# Patient Record
Sex: Male | Born: 1990 | Race: White | Hispanic: No | Marital: Married | Smoking: Current every day smoker
Health system: Southern US, Community
[De-identification: ages and names within clinical notes are randomized; demographics above are authoritative.]

## PROBLEM LIST (undated history)

## (undated) DIAGNOSIS — J329 Chronic sinusitis, unspecified: Secondary | ICD-10-CM

## (undated) DIAGNOSIS — K37 Unspecified appendicitis: Secondary | ICD-10-CM

## (undated) DIAGNOSIS — E669 Obesity, unspecified: Secondary | ICD-10-CM

## (undated) DIAGNOSIS — S42352B Displaced comminuted fracture of shaft of humerus, left arm, initial encounter for open fracture: Secondary | ICD-10-CM

## (undated) HISTORY — PX: WISDOM TOOTH EXTRACTION: SHX21

## (undated) HISTORY — PX: APPENDECTOMY: SHX54

---

## 2009-10-10 ENCOUNTER — Emergency Department (HOSPITAL_COMMUNITY): Admission: EM | Admit: 2009-10-10 | Discharge: 2009-10-10 | Payer: Self-pay | Admitting: Emergency Medicine

## 2012-12-13 ENCOUNTER — Emergency Department (HOSPITAL_COMMUNITY): Payer: 59

## 2012-12-13 ENCOUNTER — Emergency Department (HOSPITAL_COMMUNITY)
Admission: EM | Admit: 2012-12-13 | Discharge: 2012-12-13 | Disposition: A | Discharge status: Home / Self Care | Payer: 59 | Attending: Emergency Medicine | Admitting: Emergency Medicine

## 2012-12-13 ENCOUNTER — Encounter (HOSPITAL_COMMUNITY): Payer: Self-pay | Admitting: Emergency Medicine

## 2012-12-13 DIAGNOSIS — K2970 Gastritis, unspecified, without bleeding: Secondary | ICD-10-CM

## 2012-12-13 DIAGNOSIS — F17200 Nicotine dependence, unspecified, uncomplicated: Secondary | ICD-10-CM | POA: Insufficient documentation

## 2012-12-13 LAB — COMPREHENSIVE METABOLIC PANEL
ALT: 27 U/L (ref 0–53)
Albumin: 4 g/dL (ref 3.5–5.2)
Alkaline Phosphatase: 78 U/L (ref 39–117)
BUN: 12 mg/dL (ref 6–23)
Potassium: 3.8 mEq/L (ref 3.5–5.1)
Sodium: 137 mEq/L (ref 135–145)
Total Protein: 6.8 g/dL (ref 6.0–8.3)

## 2012-12-13 LAB — CBC WITH DIFFERENTIAL/PLATELET
Basophils Relative: 1 % (ref 0–1)
Eosinophils Absolute: 0.2 10*3/uL (ref 0.0–0.7)
Eosinophils Relative: 3 % (ref 0–5)
Hemoglobin: 15.8 g/dL (ref 13.0–17.0)
Lymphs Abs: 2.1 10*3/uL (ref 0.7–4.0)
MCH: 30.7 pg (ref 26.0–34.0)
MCHC: 36.7 g/dL — ABNORMAL HIGH (ref 30.0–36.0)
MCV: 83.9 fL (ref 78.0–100.0)
Monocytes Relative: 8 % (ref 3–12)
Neutro Abs: 3.4 10*3/uL (ref 1.7–7.7)
Neutrophils Relative %: 55 % (ref 43–77)
Platelets: 172 10*3/uL (ref 150–400)
RBC: 5.14 MIL/uL (ref 4.22–5.81)
RDW: 11.8 % (ref 11.5–15.5)

## 2012-12-13 LAB — URINALYSIS, ROUTINE W REFLEX MICROSCOPIC
Bilirubin Urine: NEGATIVE
Hgb urine dipstick: NEGATIVE
Nitrite: NEGATIVE
Specific Gravity, Urine: 1.025 (ref 1.005–1.030)
pH: 6 (ref 5.0–8.0)

## 2012-12-13 LAB — LIPASE, BLOOD: Lipase: 28 U/L (ref 11–59)

## 2012-12-13 MED ORDER — PANTOPRAZOLE SODIUM 40 MG IV SOLR
40.0000 mg | Freq: Once | INTRAVENOUS | Status: AC
  Administered 2012-12-13: 40 mg via INTRAVENOUS
  Filled 2012-12-13: qty 40

## 2012-12-13 MED ORDER — SODIUM CHLORIDE 0.9 % IV BOLUS (SEPSIS)
1000.0000 mL | Freq: Once | INTRAVENOUS | Status: AC
  Administered 2012-12-13: 1000 mL via INTRAVENOUS

## 2012-12-13 MED ORDER — ONDANSETRON HCL 4 MG/2ML IJ SOLN
4.0000 mg | Freq: Once | INTRAMUSCULAR | Status: AC
  Administered 2012-12-13: 4 mg via INTRAVENOUS
  Filled 2012-12-13: qty 2

## 2012-12-13 MED ORDER — HYDROMORPHONE HCL PF 1 MG/ML IJ SOLN
1.0000 mg | Freq: Once | INTRAMUSCULAR | Status: AC
  Administered 2012-12-13: 1 mg via INTRAVENOUS
  Filled 2012-12-13: qty 1

## 2012-12-13 MED ORDER — PANTOPRAZOLE SODIUM 20 MG PO TBEC: 20.0000 mg | DELAYED_RELEASE_TABLET | Freq: Every day | ORAL | Status: DC

## 2012-12-13 NOTE — ED Provider Notes (Signed)
CSN: [id number]     Arrival date & time 12/13/12  1153 History   First MD Initiated Contact with Patient 12/13/12 1219     Chief Complaint  Patient presents with  . Abdominal Pain  . Emesis   (Consider location/radiation/quality/duration/timing/severity/associated sxs/prior Treatment) HPI 22 year old male comes in today complaining of four-day history of epigastric discomfort which is exacerbated with eating. The pain is associated with some nausea some vomiting. Patient has been tolerating by mouth intermittently and in fact ate lunch just prior to coming in here and has not vomited. Not taken anything for pain. Had similar episodes in the past. He has a family history of gallstones. Not had any prior surgeries. He denies fever or chills. History reviewed. No pertinent past medical history. History reviewed. No pertinent past surgical history. History reviewed. No pertinent family history. History  Substance Use Topics  . Smoking status: Current Every Day Smoker  . Smokeless tobacco: Not on file  . Alcohol Use: Yes     Comment: occ    Review of Systems  All other systems reviewed and are negative.    Allergies  Review of patient's allergies indicates no known allergies.  Home Medications  No current outpatient prescriptions on file. BP 113/64  Pulse 57  Temp(Src) 97.8 F (36.6 C)  Resp 18  Ht 5\' 10"  (1.778 m)  Wt 266 lb 12.8 oz (121.02 kg)  BMI 38.28 kg/m2  SpO2 95% Physical Exam  Nursing note and vitals reviewed. Constitutional: He is oriented to person, place, and time. He appears well-developed and well-nourished.  HENT:  Head: Normocephalic and atraumatic.  Right Ear: External ear normal.  Left Ear: External ear normal.  Nose: Nose normal.  Mouth/Throat: Oropharynx is clear and moist.  Eyes: Conjunctivae and EOM are normal. Pupils are equal, round, and reactive to light.  Neck: Normal range of motion. Neck supple.  Cardiovascular: Normal rate, regular rhythm  and normal heart sounds.   Pulmonary/Chest: Effort normal and breath sounds normal.  Abdominal: Soft. Bowel sounds are normal.  Moderate ttp epigastrium   Musculoskeletal: Normal range of motion.  Neurological: He is alert and oriented to person, place, and time.  Skin: Skin is warm and dry.  Psychiatric: He has a normal mood and affect. His behavior is normal. Thought content normal.    ED Course  Procedures (including critical care time) Labs Review Labs Reviewed  CBC WITH DIFFERENTIAL - Abnormal; Notable for the following:    MCHC 36.7 (*)    All other components within normal limits  COMPREHENSIVE METABOLIC PANEL - Abnormal; Notable for the following:    Glucose, Bld 100 (*)    All other components within normal limits  LIPASE, BLOOD  URINALYSIS, ROUTINE W REFLEX MICROSCOPIC   Imaging Review US Abdomen Complete  12/13/2012   CLINICAL DATA:  Abdominal pain  EXAM: ULTRASOUND ABDOMEN COMPLETE  COMPARISON:  None.  FINDINGS: Gallbladder:  The gallbladder is contracted likely due to a meal approximately 30 min prior to the study. No stones are identified. The gallbladder wall is not abnormally thickened. There is no positive sonographic Murphy's sign.  Common bile duct:  Diameter: 2.8 mm.  Liver:  The echotexture of the liver is somewhat increased which limits penetration of the ultrasound beam. There is no focal mass nor ductal dilation.  IVC:  No abnormality visualized.  Pancreas:  Bowel gas limits evaluation of the pancreas but the observed portions appear normal.  Spleen:  Size and appearance within normal limits.  Right  Kidney:  Length: 10.7 cm. The echogenicity is within the limits of normal. There is no hydronephrosis. .  Left Kidney:  Length: 11.9 cm. Echogenicity within normal limits. No mass or hydronephrosis visualized.  Abdominal aorta:  Bowel gas limits evaluation of the abdominal aorta. The maximal measured diameter is 2.1 cm.  Other findings:  None.  IMPRESSION: 1. The  gallbladder is contracted likely due to the recent meal. There is no evidence of acute cholecystitis nor gallstones. 2. The liver exhibits increased echotexture which likely reflects fatty infiltration. The common bile duct is normal in diameter. 3. The observed portions of the pancreas and abdominal aorta appear normal. 4. No acute abnormality of the kidneys is demonstrated.   Electronically Signed   By: David  [place]   On: 12/13/2012 13:31    EKG Interpretation   None      Results for orders placed during the hospital encounter of 12/13/12  CBC WITH DIFFERENTIAL      Result Value Range   WBC 6.2  4.0 - 10.5 K/uL   RBC 5.14  4.22 - 5.81 MIL/uL   Hemoglobin 15.8  13.0 - 17.0 g/dL   HCT 16.1  09.6 - 04.5 %   MCV 83.9  78.0 - 100.0 fL   MCH 30.7  26.0 - 34.0 pg   MCHC 36.7 (*) 30.0 - 36.0 g/dL   RDW 40.9  81.1 - 91.4 %   Platelets 172  150 - 400 K/uL   Neutrophils Relative % 55  43 - 77 %   Neutro Abs 3.4  1.7 - 7.7 K/uL   Lymphocytes Relative 33  12 - 46 %   Lymphs Abs 2.1  0.7 - 4.0 K/uL   Monocytes Relative 8  3 - 12 %   Monocytes Absolute 0.5  0.1 - 1.0 K/uL   Eosinophils Relative 3  0 - 5 %   Eosinophils Absolute 0.2  0.0 - 0.7 K/uL   Basophils Relative 1  0 - 1 %   Basophils Absolute 0.1  0.0 - 0.1 K/uL  COMPREHENSIVE METABOLIC PANEL      Result Value Range   Sodium 137  135 - 145 mEq/L   Potassium 3.8  3.5 - 5.1 mEq/L   Chloride 105  96 - 112 mEq/L   CO2 22  19 - 32 mEq/L   Glucose, Bld 100 (*) 70 - 99 mg/dL   BUN 12  6 - 23 mg/dL   Creatinine, Ser 7.82  0.50 - 1.35 mg/dL   Calcium 9.4  8.4 - 95.6 mg/dL   Total Protein 6.8  6.0 - 8.3 g/dL   Albumin 4.0  3.5 - 5.2 g/dL   AST 19  0 - 37 U/L   ALT 27  0 - 53 U/L   Alkaline Phosphatase 78  39 - 117 U/L   Total Bilirubin 0.5  0.3 - 1.2 mg/dL   GFR calc non Af Amer >90  >90 mL/min   GFR calc Af Amer >90  >90 mL/min  LIPASE, BLOOD      Result Value Range   Lipase 28  11 - 59 U/L  URINALYSIS, ROUTINE W REFLEX  MICROSCOPIC      Result Value Range   Color, Urine YELLOW  YELLOW   APPearance CLEAR  CLEAR   Specific Gravity, Urine 1.025  1.005 - 1.030   pH 6.0  5.0 - 8.0   Glucose, UA NEGATIVE  NEGATIVE mg/dL   Hgb urine dipstick NEGATIVE  NEGATIVE  Bilirubin Urine NEGATIVE  NEGATIVE   Ketones, ur NEGATIVE  NEGATIVE mg/dL   Protein, ur NEGATIVE  NEGATIVE mg/dL   Urobilinogen, UA 1.0  0.0 - 1.0 mg/dL   Nitrite NEGATIVE  NEGATIVE   Leukocytes, UA NEGATIVE  NEGATIVE    MDM  No diagnosis found. Patient with epigastric tenderness palpation normal labs and ultrasound without any gallstones and negative lipase. I feel that it is likely that he has asterixis and he is advised to use protonic sound clear liquids for the next 12-24 hours. He is given return precautions and advised that he should return to emergency department if he develops worsening pain, fever, or is unable to tolerate liquids.  Hilario Quarry, MD 12/13/12 [phone]

## 2012-12-13 NOTE — ED Notes (Signed)
Pt c/o mid upper abd pain through to back worse after eating with N/V x 4 days

## 2013-09-08 ENCOUNTER — Encounter (HOSPITAL_COMMUNITY): Payer: Self-pay | Admitting: Emergency Medicine

## 2013-09-08 ENCOUNTER — Emergency Department (HOSPITAL_COMMUNITY)
Admission: EM | Admit: 2013-09-08 | Discharge: 2013-09-09 | Disposition: A | Discharge status: Home / Self Care | Payer: 59 | Attending: Emergency Medicine | Admitting: Emergency Medicine

## 2013-09-08 ENCOUNTER — Emergency Department (HOSPITAL_COMMUNITY): Payer: 59

## 2013-09-08 DIAGNOSIS — Y9289 Other specified places as the place of occurrence of the external cause: Secondary | ICD-10-CM | POA: Insufficient documentation

## 2013-09-08 DIAGNOSIS — S1093XA Contusion of unspecified part of neck, initial encounter: Secondary | ICD-10-CM | POA: Diagnosis not present

## 2013-09-08 DIAGNOSIS — H5702 Anisocoria: Secondary | ICD-10-CM | POA: Diagnosis not present

## 2013-09-08 DIAGNOSIS — IMO0002 Reserved for concepts with insufficient information to code with codable children: Secondary | ICD-10-CM | POA: Insufficient documentation

## 2013-09-08 DIAGNOSIS — Y9389 Activity, other specified: Secondary | ICD-10-CM | POA: Insufficient documentation

## 2013-09-08 DIAGNOSIS — S0083XA Contusion of other part of head, initial encounter: Secondary | ICD-10-CM | POA: Insufficient documentation

## 2013-09-08 DIAGNOSIS — F17200 Nicotine dependence, unspecified, uncomplicated: Secondary | ICD-10-CM | POA: Insufficient documentation

## 2013-09-08 DIAGNOSIS — Z79899 Other long term (current) drug therapy: Secondary | ICD-10-CM | POA: Insufficient documentation

## 2013-09-08 DIAGNOSIS — S0990XA Unspecified injury of head, initial encounter: Secondary | ICD-10-CM | POA: Diagnosis not present

## 2013-09-08 DIAGNOSIS — S0003XA Contusion of scalp, initial encounter: Secondary | ICD-10-CM | POA: Diagnosis not present

## 2013-09-08 MED ORDER — METOCLOPRAMIDE HCL 5 MG/ML IJ SOLN: 10.0000 mg | Freq: Once | INTRAMUSCULAR | Status: DC

## 2013-09-08 MED ORDER — ONDANSETRON HCL 4 MG PO TABS: 4.0000 mg | ORAL_TABLET | Freq: Four times a day (QID) | ORAL | Status: DC

## 2013-09-08 MED ORDER — DEXAMETHASONE SODIUM PHOSPHATE 10 MG/ML IJ SOLN
10.0000 mg | Freq: Once | INTRAMUSCULAR | Status: AC
  Administered 2013-09-08: 10 mg via INTRAVENOUS
  Filled 2013-09-08: qty 1

## 2013-09-08 MED ORDER — METOCLOPRAMIDE HCL 5 MG/ML IJ SOLN
10.0000 mg | Freq: Once | INTRAMUSCULAR | Status: AC
  Administered 2013-09-08: 10 mg via INTRAVENOUS
  Filled 2013-09-08: qty 2

## 2013-09-08 MED ORDER — NAPROXEN 500 MG PO TABS: 500.0000 mg | ORAL_TABLET | Freq: Two times a day (BID) | ORAL | Status: DC

## 2013-09-08 MED ORDER — DEXAMETHASONE SODIUM PHOSPHATE 10 MG/ML IJ SOLN: 10.0000 mg | Freq: Once | INTRAMUSCULAR | Status: DC

## 2013-09-08 NOTE — ED Notes (Signed)
Pt c/o hitting head on metal equipment while at work; pt sts pain with nausea; pt unsure of LOC

## 2013-09-08 NOTE — ED Notes (Signed)
Pt monitored by pulse ox, bp cuff, and 5-lead. 

## 2013-09-08 NOTE — Discharge Instructions (Signed)
You have been placed on head injury precautions today. Refrain from strenuous activity and heavy lifting. You may not operate heavy machinery or drive a motor vehicle until your cleared from these precautions in one week. Follow up with your primary care doctor in one week to be cleared from these precautions. Recommend naproxen as needed for pain control. You may take Zofran as needed for nausea. Return to the emergency department as needed if symptoms worsen.  Head Injury You have received a head injury. It does not appear serious at this time. Headaches and vomiting are common following head injury. It should be easy to awaken from sleeping. Sometimes it is necessary for you to stay in the emergency department for a while for observation. Sometimes admission to the hospital may be needed. After injuries such as yours, most problems occur within the first 24 hours, but side effects may occur up to 7-10 days after the injury. It is important for you to carefully monitor your condition and contact your health care provider or seek immediate medical care if there is a change in your condition. WHAT ARE THE TYPES OF HEAD INJURIES? Head injuries can be as minor as a bump. Some head injuries can be more severe. More severe head injuries include:  A jarring injury to the brain (concussion).  A bruise of the brain (contusion). This mean there is bleeding in the brain that can cause swelling.  A cracked skull (skull fracture).  Bleeding in the brain that collects, clots, and forms a bump (hematoma). WHAT CAUSES A HEAD INJURY? A serious head injury is most likely to happen to someone who is in a car wreck and is not wearing a seat belt. Other causes of major head injuries include bicycle or motorcycle accidents, sports injuries, and falls. HOW ARE HEAD INJURIES DIAGNOSED? A complete history of the event leading to the injury and your current symptoms will be helpful in diagnosing head injuries. Many times,  pictures of the brain, such as CT or MRI are needed to see the extent of the injury. Often, an overnight hospital stay is necessary for observation.  WHEN SHOULD I SEEK IMMEDIATE MEDICAL CARE?  You should get help right away if:  You have confusion or drowsiness.  You feel sick to your stomach (nauseous) or have continued, forceful vomiting.  You have dizziness or unsteadiness that is getting worse.  You have severe, continued headaches not relieved by medicine. Only take over-the-counter or prescription medicines for pain, fever, or discomfort as directed by your health care provider.  You do not have normal function of the arms or legs or are unable to walk.  You notice changes in the black spots in the center of the colored part of your eye (pupil).  You have a clear or bloody fluid coming from your nose or ears.  You have a loss of vision. During the next 24 hours after the injury, you must stay with someone who can watch you for the warning signs. This person should contact local emergency services (911 in the [place] if you have seizures, you become unconscious, or you are unable to wake up. HOW CAN I PREVENT A HEAD INJURY IN THE FUTURE? The most important factor for preventing major head injuries is avoiding motor vehicle accidents. To minimize the potential for damage to your head, it is crucial to wear seat belts while riding in motor vehicles. Wearing helmets while bike riding and playing collision sports (like football) is also helpful. Also, avoiding  dangerous activities around the house will further help reduce your risk of head injury.  WHEN CAN I RETURN TO NORMAL ACTIVITIES AND ATHLETICS? You should be reevaluated by your health care provider before returning to these activities. If you have any of the following symptoms, you should not return to activities or contact sports until 1 week after the symptoms have stopped:  Persistent headache.  Dizziness or vertigo.  Poor  attention and concentration.  Confusion.  Memory problems.  Nausea or vomiting.  Fatigue or tire easily.  Irritability.  Intolerant of bright lights or loud noises.  Anxiety or depression.  Disturbed sleep. MAKE SURE YOU:   Understand these instructions.  Will watch your condition.  Will get help right away if you are not doing well or get worse. Document Released: 01/02/2005 Document Revised: 01/07/2013 Document Reviewed: 09/09/2012 [facility] Patient Information 2015 [place], [place]. This information is not intended to replace advice given to you by your health care provider. Make sure you discuss any questions you have with your health care provider.

## 2013-09-08 NOTE — ED Provider Notes (Signed)
CSN: [id number]     Arrival date & time 09/08/13  1724 History   First MD Initiated Contact with Patient 09/08/13 2053     Chief Complaint  Patient presents with  . Head Injury     (Consider location/radiation/quality/duration/timing/severity/associated sxs/prior Treatment) HPI Comments: Patient is a 23 year old male with no significant past medical history who presents to the emergency department for further evaluation of a headache following head trauma at 1500 today. Patient states that he was getting up from a squatting position and walking backwards when he hit his head on a metal equipment at work. Patient is unsure of loss of consciousness, but does not think that he lost consciousness. Patient states that he has been experiencing a diffuse headache and nausea since the incident. Patient denies vision loss, hearing loss, difficulty speaking and swallowing, vomiting, extremity numbness/weakness, inability to ambulate.  Patient is a 23 y.o. male presenting with head injury. The history is provided by the patient. No language interpreter was used.  Head Injury Associated symptoms: headache and nausea     History reviewed. No pertinent past medical history. History reviewed. No pertinent past surgical history. History reviewed. No pertinent family history. History  Substance Use Topics  . Smoking status: Current Every Day Smoker  . Smokeless tobacco: Not on file  . Alcohol Use: Yes     Comment: occ    Review of Systems  Constitutional: Negative for fever.  Eyes: Negative for visual disturbance.  Gastrointestinal: Positive for nausea.  Skin: Positive for wound (hit back of head).  Neurological: Positive for headaches.  All other systems reviewed and are negative.    Allergies  Nutrasweet aspartame  Home Medications   Prior to Admission medications   Medication Sig Start Date End Date Taking? Authorizing Provider  Aspirin-Acetaminophen-Caffeine (EXCEDRIN PO) Take 2  tablets by mouth 2 (two) times daily as needed (for pain).   Yes Historical Provider, MD  cetirizine (ZYRTEC) 10 MG tablet Take 10 mg by mouth daily.   Yes Historical Provider, MD  naproxen (NAPROSYN) 500 MG tablet Take 1 tablet (500 mg total) by mouth 2 (two) times daily. 09/08/13   Antony Madura, PA-C  ondansetron (ZOFRAN) 4 MG tablet Take 1 tablet (4 mg total) by mouth every 6 (six) hours. 09/08/13   Antony Madura, PA-C   BP 130/86  Pulse 68  Temp(Src) 98.7 F (37.1 C) (Oral)  Resp 18  SpO2 99%  Physical Exam  Nursing note and vitals reviewed. Constitutional: He is oriented to person, place, and time. He appears well-developed and well-nourished. No distress.  Nontoxic/nonseptic appearing  HENT:  Head: Normocephalic.  Mouth/Throat: Oropharynx is clear and moist. No oropharyngeal exudate.  Small contusion to posterior scalp. No hematoma. No skull instability.  Eyes: Conjunctivae and EOM are normal. No scleral icterus.  Anisocoria. Pupils round and reactive to direct and consensual light.  Neck: Normal range of motion.  Cardiovascular: Normal rate, regular rhythm and intact distal pulses.   Pulmonary/Chest: Effort normal and breath sounds normal. No respiratory distress. He has no wheezes. He has no rales.  Musculoskeletal: Normal range of motion.  Neurological: He is alert and oriented to person, place, and time. No cranial nerve deficit. He exhibits normal muscle tone. Coordination normal.  GCS 15. Patient speaks in full goal oriented sentences. He answers questions appropriately and follows simple commands. No cranial nerve deficits appreciated; symmetric eyebrow raise, no facial drooping, tongue midline. Patient has equal grip strength and strength against resistance bilaterally; strength 5/5. No gross  sensory deficits appreciated. Patient moves extremities without ataxia; no pronator drift and finger-to-nose intact. DTRs normal and symmetric.  Skin: Skin is warm and dry. No rash noted. He  is not diaphoretic. No erythema. No pallor.  Psychiatric: He has a normal mood and affect. His behavior is normal.    ED Course  Procedures (including critical care time) Labs Review Labs Reviewed - No data to display  Imaging Review Ct Head Wo Contrast  09/08/2013   CLINICAL DATA:  Headaches after being struck on the top of the head.  EXAM: CT HEAD WITHOUT CONTRAST  TECHNIQUE: Contiguous axial images were obtained from the base of the skull through the vertex without intravenous contrast.  COMPARISON:  None.  FINDINGS: Ventricles and sulci appear symmetrical. No mass effect or midline shift. No abnormal extra-axial fluid collections. Gray-white matter junctions are distinct. Basal cisterns are not effaced. No evidence of acute intracranial hemorrhage. No depressed skull fractures. Mucosal thickening in the paranasal sinuses. Mastoid air cells appear patent.  IMPRESSION: No acute intracranial abnormalities.   Electronically Signed   By: Burman Nieves M.D.   On: 09/08/2013 23:20     EKG Interpretation None      MDM   Final diagnoses:  Head injury without skull fracture, initial encounter    23 year old male presents to the emergency department for further evaluation of a head injury. Patient states that his head hit some metal equipment at work at 1500. Patient unsure of LOC, but does not believe he lost consciousness. Patient is well and nontoxic appearing with a nonfocal neurologic exam today. Symptoms treatment in the ED with Decadron and Reglan.  CT head ordered for further evaluation of injury which shows no evidence of skull fracture, hemorrhage, or hydrocephalus. No midline shift. Patient has remained neurologically stable. He is sleeping in the exam room bed in no visible or audible discomfort on reevaluation; pain improved with headache regimen. Patient placed on head injury precautions. He has been instructed to followup with his primary care doctor in one week to be cleared  from his precautions. Return precautions discussed and provided. Patient agreeable to plan with no unaddressed concerns.   Filed Vitals:   09/08/13 2130 09/08/13 2200 09/08/13 2215 09/08/13 2332  BP: 147/96 114/67 131/96 130/86  Pulse: 75 73 70 68  Temp:      TempSrc:      Resp: SpO2: 98% 98% 95% 99%     Antony Madura, PA-C 09/08/13 2353

## 2013-09-08 NOTE — ED Notes (Signed)
Apologized to pt for wait time. Pt in NAD. AO x4.  

## 2013-09-09 NOTE — ED Notes (Signed)
Pt denies headache or dizziness. gcs 15. perrrla.

## 2013-09-09 NOTE — ED Provider Notes (Signed)
Medical screening examination/treatment/procedure(s) were performed by non-physician practitioner and as supervising physician I was immediately available for consultation/collaboration.   EKG Interpretation None        StephenGlynn Octave 09/09/13 [id number]

## 2014-02-22 ENCOUNTER — Encounter (HOSPITAL_COMMUNITY): Payer: Self-pay | Admitting: *Deleted

## 2014-02-22 ENCOUNTER — Emergency Department (INDEPENDENT_AMBULATORY_CARE_PROVIDER_SITE_OTHER): Payer: 59

## 2014-02-22 ENCOUNTER — Emergency Department (INDEPENDENT_AMBULATORY_CARE_PROVIDER_SITE_OTHER)
Admission: EM | Admit: 2014-02-22 | Discharge: 2014-02-22 | Disposition: A | Discharge status: Home / Self Care | Payer: 59 | Source: Home / Self Care | Attending: Emergency Medicine | Admitting: Emergency Medicine

## 2014-02-22 DIAGNOSIS — J209 Acute bronchitis, unspecified: Secondary | ICD-10-CM

## 2014-02-22 MED ORDER — KETOROLAC TROMETHAMINE 60 MG/2ML IM SOLN
INTRAMUSCULAR | Status: AC
  Filled 2014-02-22: qty 2

## 2014-02-22 MED ORDER — PREDNISONE 50 MG PO TABS: ORAL_TABLET | ORAL | Status: DC

## 2014-02-22 MED ORDER — DOXYCYCLINE HYCLATE 100 MG PO CAPS: 100.0000 mg | ORAL_CAPSULE | Freq: Two times a day (BID) | ORAL | Status: DC

## 2014-02-22 MED ORDER — KETOROLAC TROMETHAMINE 60 MG/2ML IM SOLN
60.0000 mg | Freq: Once | INTRAMUSCULAR | Status: AC
  Administered 2014-02-22: 60 mg via INTRAMUSCULAR

## 2014-02-22 MED ORDER — BENZONATATE 100 MG PO CAPS: 100.0000 mg | ORAL_CAPSULE | Freq: Three times a day (TID) | ORAL | Status: DC

## 2014-02-22 MED ORDER — HYDROCODONE-HOMATROPINE 5-1.5 MG/5ML PO SYRP: 5.0000 mL | ORAL_SOLUTION | Freq: Four times a day (QID) | ORAL | Status: DC | PRN

## 2014-02-22 NOTE — ED Provider Notes (Addendum)
CSN: [id number]     Arrival date & time 02/22/14  1630 History   First MD Initiated Contact with Patient 02/22/14 1648     Chief Complaint  Patient presents with  . Cough   (Consider location/radiation/quality/duration/timing/severity/associated sxs/prior Treatment) HPI He is a 24 year old man here for evaluation of cough. His symptoms started about 3 days ago with cough, nasal congestion, sore throat, shortness of breath. The cough is nonproductive. He denies any fevers or chills. No nausea or vomiting. Over the last day or so, he has developed pain in his rib cage with coughing and movement. He has tried over-the-counter Delsym without improvement.  History reviewed. No pertinent past medical history. History reviewed. No pertinent past surgical history. History reviewed. No pertinent family history. History  Substance Use Topics  . Smoking status: Current Every Day Smoker  . Smokeless tobacco: Not on file  . Alcohol Use: Yes     Comment: occ    Review of Systems  Constitutional: Negative for fever and chills.  HENT: Positive for congestion, rhinorrhea and sore throat.   Respiratory: Positive for cough and shortness of breath. Negative for wheezing.   Cardiovascular: Positive for chest pain (with cough).  Gastrointestinal: Negative for nausea and vomiting.    Allergies  Nutrasweet aspartame  Home Medications   Prior to Admission medications   Medication Sig Start Date End Date Taking? Authorizing Provider  Aspirin-Acetaminophen-Caffeine (EXCEDRIN PO) Take 2 tablets by mouth 2 (two) times daily as needed (for pain).    Historical Provider, MD  benzonatate (TESSALON) 100 MG capsule Take 1 capsule (100 mg total) by mouth every 8 (eight) hours. 02/22/14   Charm Rings, MD  cetirizine (ZYRTEC) 10 MG tablet Take 10 mg by mouth daily.    Historical Provider, MD  doxycycline (VIBRAMYCIN) 100 MG capsule Take 1 capsule (100 mg total) by mouth 2 (two) times daily. 02/22/14   Charm Rings,  MD  HYDROcodone-homatropine [facility]) 5-1.5 MG/5ML syrup Take 5 mLs by mouth every 6 (six) hours as needed for cough. 02/22/14   Charm Rings, MD  naproxen (NAPROSYN) 500 MG tablet Take 1 tablet (500 mg total) by mouth 2 (two) times daily. 09/08/13   Antony Madura, PA-C  ondansetron (ZOFRAN) 4 MG tablet Take 1 tablet (4 mg total) by mouth every 6 (six) hours. 09/08/13   Antony Madura, PA-C  predniSONE (DELTASONE) 50 MG tablet Take 1 pill daily for 5 days 02/22/14   Charm Rings, MD   BP 135/84 mmHg  Pulse 97  Temp(Src) 98.4 F (36.9 C) (Oral)  Resp 24  SpO2 96% Physical Exam  Constitutional: He is oriented to person, place, and time. He appears well-developed and well-nourished. No distress.  Neck: Neck supple.  Cardiovascular: Normal rate, regular rhythm and normal heart sounds.   No murmur heard. Pulmonary/Chest: Effort normal and breath sounds normal. No respiratory distress. He has no wheezes. He has no rales.  Speaking in full sentences but does appear short of breath.  Lymphadenopathy:    He has no cervical adenopathy.  Neurological: He is alert and oriented to person, place, and time.    ED Course  Procedures (including critical care time) Labs Review Labs Reviewed - No data to display  Imaging Review Dg Chest 2 View  02/22/2014   CLINICAL DATA:  Cough for 3 days.  EXAM: CHEST  2 VIEW  COMPARISON:  None.  FINDINGS: Examination is technically limited due to grid aliasing artifact. The heart size and mediastinal contours  are within normal limits. Both lungs are clear. The visualized skeletal structures are unremarkable.  IMPRESSION: No active cardiopulmonary disease.   Electronically Signed   By: Burman NievesWilliam  Stevens M.D.   On: 02/22/2014 17:55     MDM   1. Acute bronchitis, unspecified organism    We'll treat with prednisone and doxycycline. He also has associated costochondritis. Tessalon and Hycodan for cough. Follow-up if no improvement in the next 2-3 days.  toradol 60mg  IM  given.  Charm RingsErin J Keelyn Fjelstad, MD 02/22/14 [id number]  Charm RingsErin J Tywana Robotham, MD 02/24/14 [phone]

## 2014-02-22 NOTE — ED Notes (Signed)
Pt  Reports  Symptoms  Of   A  Non  Productive    Cough  With  Some  Shortness  Of  Breath  On  Exertion    Pain  In  Sides  Of  Chest  When        He  Coughs  And   On palpation           He  Reports  The  Symptoms  X  3  Days        Symptoms  Are  Not releived  By OTC  Delsym

## 2014-02-22 NOTE — Discharge Instructions (Signed)
You have bronchitis.  The pain you have with coughing is from irritation of your ribs and cartilage. Take prednisone 1 pill daily for 5 days. Take doxycycline twice a day for 10 days. Use Tessalon 3 times a day as needed for cough. This is a nondrowsy medication. He continues Hycodan cough syrup every 4-6 hours as needed for cough. This has a narcotic medication and it so do not take while driving.  You should see improvement in 2-3 days. The cough will likely take 2 weeks to fully resolve.

## 2014-08-10 ENCOUNTER — Emergency Department (HOSPITAL_COMMUNITY): Payer: Worker's Compensation

## 2014-08-10 ENCOUNTER — Encounter (HOSPITAL_COMMUNITY): Payer: Self-pay | Admitting: Family Medicine

## 2014-08-10 ENCOUNTER — Emergency Department (HOSPITAL_COMMUNITY)
Admission: EM | Admit: 2014-08-10 | Discharge: 2014-08-10 | Disposition: A | Discharge status: Home / Self Care | Payer: Worker's Compensation | Attending: Emergency Medicine | Admitting: Emergency Medicine

## 2014-08-10 DIAGNOSIS — Y998 Other external cause status: Secondary | ICD-10-CM | POA: Insufficient documentation

## 2014-08-10 DIAGNOSIS — Z720 Tobacco use: Secondary | ICD-10-CM | POA: Diagnosis not present

## 2014-08-10 DIAGNOSIS — Y9389 Activity, other specified: Secondary | ICD-10-CM | POA: Insufficient documentation

## 2014-08-10 DIAGNOSIS — Z79899 Other long term (current) drug therapy: Secondary | ICD-10-CM | POA: Diagnosis not present

## 2014-08-10 DIAGNOSIS — S8991XA Unspecified injury of right lower leg, initial encounter: Secondary | ICD-10-CM

## 2014-08-10 DIAGNOSIS — Z7952 Long term (current) use of systemic steroids: Secondary | ICD-10-CM | POA: Insufficient documentation

## 2014-08-10 DIAGNOSIS — S8001XA Contusion of right knee, initial encounter: Secondary | ICD-10-CM | POA: Insufficient documentation

## 2014-08-10 DIAGNOSIS — Z791 Long term (current) use of non-steroidal anti-inflammatories (NSAID): Secondary | ICD-10-CM | POA: Diagnosis not present

## 2014-08-10 DIAGNOSIS — Y9289 Other specified places as the place of occurrence of the external cause: Secondary | ICD-10-CM | POA: Diagnosis not present

## 2014-08-10 DIAGNOSIS — W1789XA Other fall from one level to another, initial encounter: Secondary | ICD-10-CM | POA: Diagnosis not present

## 2014-08-10 DIAGNOSIS — Z7982 Long term (current) use of aspirin: Secondary | ICD-10-CM | POA: Diagnosis not present

## 2014-08-10 MED ORDER — HYDROCODONE-ACETAMINOPHEN 5-325 MG PO TABS
2.0000 | ORAL_TABLET | Freq: Once | ORAL | Status: AC
  Administered 2014-08-10: 2 via ORAL
  Filled 2014-08-10: qty 2

## 2014-08-10 MED ORDER — HYDROCODONE-ACETAMINOPHEN 5-325 MG PO TABS
1.0000 | ORAL_TABLET | Freq: Four times a day (QID) | ORAL | Status: DC | PRN
Start: 2014-08-10 — End: 2016-05-25

## 2014-08-10 MED ORDER — IBUPROFEN 800 MG PO TABS: 800.0000 mg | ORAL_TABLET | Freq: Three times a day (TID) | ORAL | Status: DC

## 2014-08-10 NOTE — ED Notes (Signed)
Pt here for right knee pain. 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 sts knee hit concrete. Sts that he was given tramadol and xray and no broken bones. sts the pain meds are not working and knee feels like it is popping and grinding.

## 2014-08-10 NOTE — Discharge Instructions (Signed)
Knee Sprain A knee sprain is a tear in one of the strong, fibrous tissues that connect the bones (ligaments) in your knee. The severity of the sprain depends on how much of the ligament is torn. The tear can be either partial or complete. CAUSES  Often, sprains are a result of a fall or injury. The force of the impact causes the fibers of your ligament to stretch too much. This excess tension causes the fibers of your ligament to tear. SIGNS AND SYMPTOMS  You may have some loss of motion in your knee. Other symptoms include:  Bruising.  Pain in the knee area.  Tenderness of the knee to the touch.  Swelling. DIAGNOSIS  To diagnose a knee sprain, your health care provider will physically examine your knee. Your health care provider may also suggest an X-ray exam of your knee to make sure no bones are broken. TREATMENT  If your ligament is only partially torn, treatment usually involves keeping the knee in a fixed position (immobilization) or bracing your knee for activities that require movement for several weeks. To do this, your health care provider will apply a bandage, cast, or splint to keep your knee from moving and to support your knee during movement until it heals. For a partially torn ligament, the healing process usually takes 4-6 weeks. If your ligament is completely torn, depending on which ligament it is, you may need surgery to reconnect the ligament to the bone or reconstruct it. After surgery, a cast or splint may be applied and will need to stay on your knee for 4-6 weeks while your ligament heals. HOME CARE INSTRUCTIONS  Keep your injured knee elevated to decrease swelling.  To ease pain and swelling, apply ice to the injured area:  Put ice in a plastic bag.  Place a towel between your skin and the bag.  Leave the ice on for 20 minutes, 2-3 times a day.  Only take medicine for pain as directed by your health care provider.  Do not leave your knee unprotected until  pain and stiffness go away (usually 4-6 weeks).  If you have a cast or splint, do not allow it to get wet. If you have been instructed not to remove it, cover it with a plastic bag when you shower or bathe. Do not swim.  Your health care provider may suggest exercises for you to do during your recovery to prevent or limit permanent weakness and stiffness. SEEK IMMEDIATE MEDICAL CARE IF:  Your cast or splint becomes damaged.  Your pain becomes worse.  You have significant pain, swelling, or numbness below the cast or splint. MAKE SURE YOU:  Understand these instructions.  Will watch your condition.  Will get help right away if you are not doing well or get worse. Document Released: 01/02/2005 Document Revised: 10/23/2012 Document Reviewed: 08/14/2012 [facility] Patient Information 2015 [place], [place]. This information is not intended to replace advice given to you by your health care provider. Make sure you discuss any questions you have with your health care provider.  Contusion A contusion is a deep bruise. Contusions are the result of an injury that caused bleeding under the skin. The contusion may turn blue, purple, or yellow. Minor injuries will give you a painless contusion, but more severe contusions may stay painful and swollen for a few weeks.  CAUSES  A contusion is usually caused by a blow, trauma, or direct force to an area of the body. SYMPTOMS   Swelling and redness  of the injured area.  Bruising of the injured area.  Tenderness and soreness of the injured area.  Pain. DIAGNOSIS  The diagnosis can be made by taking a history and physical exam. An X-ray, CT scan, or MRI may be needed to determine if there were any associated injuries, such as fractures. TREATMENT  Specific treatment will depend on what area of the body was injured. In general, the best treatment for a contusion is resting, icing, elevating, and applying cold compresses to the injured area.  Over-the-counter medicines may also be recommended for pain control. Ask your caregiver what the best treatment is for your contusion. HOME CARE INSTRUCTIONS   Put ice on the injured area.  Put ice in a plastic bag.  Place a towel between your skin and the bag.  Leave the ice on for 15-20 minutes, 3-4 times a day, or as directed by your health care provider.  Only take over-the-counter or prescription medicines for pain, discomfort, or fever as directed by your caregiver. Your caregiver may recommend avoiding anti-inflammatory medicines (aspirin, ibuprofen, and naproxen) for 48 hours because these medicines may increase bruising.  Rest the injured area.  If possible, elevate the injured area to reduce swelling. SEEK IMMEDIATE MEDICAL CARE IF:   You have increased bruising or swelling.  You have pain that is getting worse.  Your swelling or pain is not relieved with medicines. MAKE SURE YOU:   Understand these instructions.  Will watch your condition.  Will get help right away if you are not doing well or get worse. Document Released: 10/12/2004 Document Revised: 01/07/2013 Document Reviewed: 11/07/2010 [facility] Patient Information 2015 [place], [place]. This information is not intended to replace advice given to you by your health care provider. Make sure you discuss any questions you have with your health care provider.   Emergency Department Resource Guide 1) Find a Doctor and Pay Out of Pocket Although you won't have to find out who is covered by your insurance plan, it is a good idea to ask around and get recommendations. You will then need to call the office and see if the doctor you have chosen will accept you as a new patient and what types of options they offer for patients who are self-pay. Some doctors offer discounts or will set up payment plans for their patients who do not have insurance, but you will need to ask so you aren't surprised when you get to your  appointment.  2) Contact Your Local Health Department Not all health departments have doctors that can see patients for sick visits, but many do, so it is worth a call to see if yours does. If you don't know where your local health department is, you can check in your phone book. The CDC also has a tool to help you locate your state's health department, and many state websites also have listings of all of their local health departments.  3) Find a Walk-in Clinic If your illness is not likely to be very severe or complicated, you may want to try a walk in clinic. These are popping up all over the country in pharmacies, drugstores, and shopping centers. They're usually staffed by nurse practitioners or physician assistants that have been trained to treat common illnesses and complaints. They're usually fairly quick and inexpensive. However, if you have serious medical issues or chronic medical problems, these are probably not your best option.  No Primary Care Doctor: - Call Health Connect at  [phone] - they can  help you locate a primary care doctor that  accepts your insurance, provides certain services, etc. - Physician Referral Service- [phone]  Chronic Pain Problems: Organization         Address  Phone   Notes  [facility]  [phone] Patients need to be referred by their primary care doctor.   Medication Assistance: Organization         Address  Phone   Notes  [facility] [address] [place] [place], [place] [address] [phone] --Must be a resident of [facility] -- Must have NO insurance coverage whatsoever (no Medicaid/ Medicare, etc.) -- The pt. MUST have a primary care doctor that directs their care regularly and follows them in the community   [facility]ist  [phone]   [employer]  [phone]    Agencies that provide inexpensive medical care: Organization         Address  Phone   Notes  [facility]  [phone]   [facility]    [phone]   [facility] [address] [place], [place] [address] [phone]   [facility] [address] [place] [place] [phone]   Planned Parenthood    [phone]   [facility]    [phone]   [facility]  [address], [place] Phone:  [phone], Fax:  [phone] Hours of Operation:  9 am - 6 pm, M-F.  Also accepts Medicaid/Medicare and self-pay.  [facility]  [address], [place] Phone: [phone], Fax: [phone]. Hours of Operation:  8:30 am - 5:30 pm, M-F.  Also accepts Medicaid and self-pay.  [facility] [place] [address], [place] Phone: [phone]   [facility] [address] Natasha Bence [place], [place] [phone], Ext. [phone] Mondays & Thursdays: 7-9 AM.  First 15 patients are seen on a first come, first serve basis.    Medicaid-accepting [facility] Providers:  Organization         Address  Phone   Notes  [facility] [address], [place] [phone] Also accepts self-pay patients.  [facility] [address] Laurell Josephs [place], [place]  [phone]   [facility] [address], [place] [phone]   [facility] [address], [place] [phone]   Renaye Rakers [address], [place]   [phone] Only accepts [place] Access [place] patients after they have their name applied to their card.   Self-Pay (no insurance) in [facility]:  Organization         Address  Phone   Notes  Sickle Cell Patients, [facility] Internal Medicine [address] [place], [place] [phone]   [facility] [address] [place], [place] [phone]   [facility]  [place]  [address], [place] [phone]   [facility]/Dr. Osei-Bonsu  [address], [place] or [address], [place] [phone] Phone number for both [place] and [place] locations is the same.  [facility] [address], [place] [phone]   [facility] [address], [place] or [address] [phone]   [facility] [address], [place] [phone], phone; [phone], fax Sees patients 1st and 3rd Saturday of every month.  Must not qualify for public or private insurance (i.e. Medicaid, Medicare, [place] Health Choice, Veterans' Benefits)  Household income should be no more than 200% of the poverty level The clinic cannot treat you if you are pregnant or think you are pregnant  Sexually transmitted diseases are not treated at the clinic.    Dental Care: Organization         Address  Phone  Notes  [facility] Department of [facility] [address] [place], [place] [phone] Accepts children up to age 40 who are enrolled in [place] or [place] Health Choice; pregnant women with a Medicaid card; and children who have applied for Medicaid or [place] Health Choice, but were declined, whose parents can pay a reduced fee at time of service.  [facility] Department of [facility]  [address], [place] [phone] Accepts children up to age 52 who are enrolled in [place] or [place] Health Choice; pregnant women with a Medicaid card; and children who have applied for Medicaid or [place] Health Choice, but were declined, whose parents can pay a reduced fee at time of service.  [facility]  [address] [place], [place] [phone] Patients are seen by appointment only. Walk-ins are not accepted. [facility] will see patients 25 years of age and  older. Monday - Tuesday (8am-5pm) Most Wednesdays (8:30-5pm) $30 per visit, cash only  [facility]  [address], [facility] [phone] Patients are seen by appointment only. Walk-ins are not accepted. [facility] will see patients 53 years of age and older. One Wednesday Evening (Monthly: Volunteer Based).  $30 per visit, cash only  [employer]  [phone] for adults; Children under age 1, call Graduate Pediatric Dentistry at [phone]. Children aged 61-14, please call [phone] to request a pediatric application.  Dental services are provided in all areas of dental care including fillings, crowns and bridges, complete and partial dentures, implants, gum treatment, root canals, and extractions. Preventive care is also provided. Treatment is provided to both adults and children. Patients are selected via a lottery and there is often a waiting list.   [facility] [address] [place]  [phone] [email or web address]   [facility] [address] [place], [place] [phone], Ext. [phone] Second and Fourth Thursday of each month, opens at 6:30 AM; Clinic ends at 9 AM.  Patients are seen on a first-come first-served basis, and a limited number are seen during each clinic.   [facility]  [address] Ether Griffins [place], [place] [phone]   Eligibility Requirements You must have lived in [place], [place], or [place] counties for at least the last three months.   You cannot be eligible for state or federal sponsored [place], including [employer], [place], or [employer].   You generally cannot be eligible for healthcare insurance through your employer.    How to apply: Eligibility screenings are held every Tuesday and Wednesday afternoon from 1:00 pm until 4:00 pm. You do not need an appointment for the interview!  [facility] [address],  [place], [place] [phone]   [facility]  [phone]   [facility]  [phone]   [facility]  [phone]    Behavioral Health Resources in the Community: Intensive Outpatient Programs Organization         Address  Phone  Notes  [facility] [address], [place], [place] [phone]   [facility] Outpatient [address] [place], [place] [phone]   [facility] [address] [place], [place]  [phone]   [facility] Mental Health [address],  [place], [place] [phone] or [phone]   Substance Abuse Resources Organization         Address  Phone  Notes  [facility]  [phone]   [facility]  [phone]   [facility] [place]  [phone]   Floydene Flock  [phone]   Residential & Outpatient Substance Abuse Program  [phone]   Psychological Services Organization         Address  Phone  Notes  [facility]  [phone]   [facility] Services  [phone]   [facility] Mental Health [address] [place] [phone] or [phone]    Mobile Crisis Teams Organization         Address  Phone  Notes  [facility], Mobile Crisis Care Unit  [phone]   [facility]  [address] [place], [place] [phone]   Doristine Locks [address] [place] [phone]    Self-Help/Support Groups Organization         Address  Phone             Notes  [facility] [place] - variety of support groups  336- I7437963 Call for more information  Narcotics Anonymous (NA), [facility] [address], [employer] [place]  2 meetings at this location   Statistician         Address  Phone  Notes  [facility] [address],    [place]  [phone]   [facility]  [address], [place] [address], [place], [place] [phone]   [facility] [address] [place], [place] [phone] Admissions: 8am-3pm M-F  [facility] [address]    [place], [place] [phone]   [facility] [address] [place], [place], [place] [phone]   [facility] [address]  [place], [place] [phone]   [facility] - Intensive Outpatient [address] Laurell Josephs [address], [place], [place] [phone]   [facility] ([facility]iction [facility] [address] [place]  [place], [place] [phone] or [phone]   [facility] ([facility]) [address] [place], [place] [phone] Accepts Medicaid  [facility] [address]  [place] [phone] Substance Abuse/Addiction Treatment   [facility] Organization         Address  Phone  Notes  [facility]  [phone]   Angie Fava, PhD [address] Ervin Knack [place], [place]   [phone] or [phone]   [facility]   [address] [place], [place] [phone]   [facility] [address] [place], [place] [phone] Insurance/Medicaid/sponsorship through [employer] and Families [address]                                    [place], [place] (  [phone] [place], [place] [phone]    Dr. Adele Schilder  [phone]   [facility] Dept. 1) [address] [place] 2) [place] 3)  [address], [place] [phone]  [phone]   [place] [phone] or [phone] (After Hours)

## 2014-08-10 NOTE — ED Provider Notes (Signed)
CSN: [id number]     Arrival date & time 08/10/14  6578 History   This chart was scribed for Phillip Mow, PA-C working with No att. providers found by Elveria Rising, ED Scribe. This patient was seen in room TR05C/TR05C and the patient's care was started at 11:21 AM.   Chief Complaint  Patient presents with  . Knee Pain   The history is provided by the patient. No language interpreter was used.   HPI Comments: Teruo Mcdonald is a 24 y.o. male who presents to the Emergency Department complaining of unimproved right knee pain resulting from an injury two days ago. 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 Patient was evaluated in [place]; imaging performed was negative; patient was discharged with Tramadol and fitted with knee brace. Patient denies improvement or relief of his pain with treatment. Patient locates pain to inferior and medial of knee, with overlying bruising and small abrasions. Patient characterizes pain as a "popping and grinding" sensation that is exacerbated with flexion.   History reviewed. No pertinent past medical history. History reviewed. No pertinent past surgical history. History reviewed. No pertinent family history. History  Substance Use Topics  . Smoking status: Current Every Day Smoker  . Smokeless tobacco: Not on file  . Alcohol Use: Yes     Comment: occ    Review of Systems  Constitutional: Negative for fever.  Musculoskeletal: Positive for arthralgias. Negative for joint swelling.  Skin: Positive for color change (bruising to knee) and wound.  Neurological: Negative for numbness.    Allergies  Nutrasweet aspartame  Home Medications   Prior to Admission medications   Medication Sig Start Date End Date Taking? Authorizing Provider  Aspirin-Acetaminophen-Caffeine (EXCEDRIN PO) Take 2 tablets by mouth 2 (two) times daily as needed (for pain).    Historical Provider, MD  benzonatate  (TESSALON) 100 MG capsule Take 1 capsule (100 mg total) by mouth every 8 (eight) hours. 02/22/14   Charm Rings, MD  cetirizine (ZYRTEC) 10 MG tablet Take 10 mg by mouth daily.    Historical Provider, MD  doxycycline (VIBRAMYCIN) 100 MG capsule Take 1 capsule (100 mg total) by mouth 2 (two) times daily. 02/22/14   Charm Rings, MD  HYDROcodone-acetaminophen (NORCO/VICODIN) 5-325 MG per tablet Take 1-2 tablets by mouth every 6 (six) hours as needed. 08/10/14   Phillip Mow, PA-C  HYDROcodone-homatropine (HYCODAN) 5-1.5 MG/5ML syrup Take 5 mLs by mouth every 6 (six) hours as needed for cough. 02/22/14   Charm Rings, MD  ibuprofen (ADVIL,MOTRIN) 800 MG tablet Take 1 tablet (800 mg total) by mouth 3 (three) times daily. 08/10/14   Phillip Mow, PA-C  naproxen (NAPROSYN) 500 MG tablet Take 1 tablet (500 mg total) by mouth 2 (two) times daily. 09/08/13   Antony Madura, PA-C  ondansetron (ZOFRAN) 4 MG tablet Take 1 tablet (4 mg total) by mouth every 6 (six) hours. 09/08/13   Antony Madura, PA-C  predniSONE (DELTASONE) 50 MG tablet Take 1 pill daily for 5 days 02/22/14   Charm Rings, MD   Triage Vitals: BP 153/101 mmHg  Pulse 80  Temp(Src) 97.7 F (36.5 C) (Oral)  Resp 20  SpO2 99% Physical Exam  Constitutional: He is oriented to person, place, and time. He appears well-developed and well-nourished. No distress.  HENT:  Head: Normocephalic and atraumatic.  Eyes: EOM are normal.  Neck: Neck supple. No tracheal deviation present.  Cardiovascular: Normal rate.  Pulmonary/Chest: Effort normal. No respiratory distress.  Musculoskeletal: Normal range of motion.  Bruising to medial joint line. No anterior or posterior medial or lateral instability. No obvious effusion. Full active and passive range of motion of knee with pain to both. DP pulse 2+. Distal sensation intact.  Neurological: He is alert and oriented to person, place, and time.  Skin: Skin is warm and dry.  Psychiatric: He has a normal mood and affect. His  behavior is normal.  Nursing note and vitals reviewed.   ED Course  Procedures (including critical care time)  COORDINATION OF CARE: 11:27 AM- Will review imaging. Discussed treatment plan with patient at bedside and patient agreed to plan.   Labs Review Labs Reviewed - No data to display  Imaging Review Ct Knee Right Wo Contrast  08/10/2014   CLINICAL DATA:  The struck on concrete. Pain and bruising. Popping and grinding sensation.  EXAM: CT OF THE RIGHT KNEE WITHOUT CONTRAST  TECHNIQUE: Multidetector CT imaging of the right knee was performed according to the standard protocol. Multiplanar CT image reconstructions were also generated.  COMPARISON:  08/10/2014  FINDINGS: No fracture observed. No significant knee effusion. Minimal spurring at the distal quadriceps insertion site.  Subcutaneous edema superficial to the medial and lateral patellar retinacula. Slight lateral patellar tilt. Mild subcutaneous edema anterior to the patellar tendon.  IMPRESSION: 1. Mild anterior subcutaneous edema. No knee effusion or fracture identified. 2. Slight lateral patellar tilt, without patellar subluxation.   Electronically Signed   By: Gaylyn Rong M.D.   On: 08/10/2014 12:29   Dg Knee Complete 4 Views Right  08/10/2014   CLINICAL DATA:  Right knee pain following injury, initial encounter  EXAM: RIGHT KNEE - COMPLETE 4+ VIEW  COMPARISON:  None.  FINDINGS: There is no evidence of fracture, dislocation, or joint effusion. There is no evidence of arthropathy or other focal bone abnormality. Soft tissues are unremarkable.  IMPRESSION: No acute abnormality noted.   Electronically Signed   By: Alcide Clever M.D.   On: 08/10/2014 11:01     EKG Interpretation None      MDM   Final diagnoses:  Knee injury, right, initial encounter    Given the fact is that patient's second visit to an ER for this same knee pain status post mechanical fall, landing directly on his knee while carrying heavy equipment,  unclear etiology of patient's pain, and there is concern for occult injury that is not visible on x-ray. Patient has pain out of proportion to exam, and with his weight and mechanism of injury, tenderness to anterior, proximal tibia with associated ecchymosis, and worsening pain over the past several days, believe CT of his knee is warranted to rule out tibial plateau fracture. Patient is neurovascularly intact.  CT right knee: 1. Mild anterior subcutaneous edema. No knee effusion or fracture identified. 2. Slight lateral patellar tilt, without patellar subluxation.  Believe these findings to be physiologic. Likely signs and symptoms secondary to possible internal derangement injury. Place patient in knee immobilizer, and discharged home with crutches. Patient strongly encouraged to follow-up with orthopedics. Return precautions discussed, patient verbalizes understanding and agreement of this plan.  I personally performed the services described in this documentation, which was scribed in my presence. The recorded information has been reviewed and is accurate.  BP 128/86 mmHg  Pulse 72  Temp(Src) 97.8 F (36.6 C) (Oral)  Resp 18  SpO2 96%  Signed,  Phillip Mow, PA-C 5:58 PM    Phillip Mow,  PA-C 08/10/14 1759  Richardean Canal, MD 08/10/14 [phone]

## 2014-09-01 ENCOUNTER — Other Ambulatory Visit (HOSPITAL_COMMUNITY): Payer: Self-pay | Admitting: Orthopaedic Surgery

## 2014-09-01 DIAGNOSIS — M25561 Pain in right knee: Secondary | ICD-10-CM

## 2014-09-10 ENCOUNTER — Other Ambulatory Visit (HOSPITAL_COMMUNITY): Payer: Self-pay | Admitting: Orthopaedic Surgery

## 2014-09-10 ENCOUNTER — Ambulatory Visit (HOSPITAL_COMMUNITY)
Admission: RE | Admit: 2014-09-10 | Discharge: 2014-09-10 | Disposition: A | Discharge status: Home / Self Care | Payer: Worker's Compensation | Source: Ambulatory Visit | Attending: Orthopaedic Surgery | Admitting: Orthopaedic Surgery

## 2014-09-10 DIAGNOSIS — R600 Localized edema: Secondary | ICD-10-CM | POA: Diagnosis not present

## 2014-09-10 DIAGNOSIS — M25561 Pain in right knee: Secondary | ICD-10-CM | POA: Insufficient documentation

## 2015-01-28 ENCOUNTER — Encounter (HOSPITAL_COMMUNITY): Payer: Self-pay | Admitting: *Deleted

## 2015-01-28 ENCOUNTER — Emergency Department (INDEPENDENT_AMBULATORY_CARE_PROVIDER_SITE_OTHER)
Admission: EM | Admit: 2015-01-28 | Discharge: 2015-01-28 | Disposition: A | Discharge status: Home / Self Care | Payer: Self-pay | Source: Home / Self Care | Attending: Family Medicine | Admitting: Family Medicine

## 2015-01-28 ENCOUNTER — Emergency Department (INDEPENDENT_AMBULATORY_CARE_PROVIDER_SITE_OTHER): Payer: 59

## 2015-01-28 DIAGNOSIS — J069 Acute upper respiratory infection, unspecified: Secondary | ICD-10-CM

## 2015-01-28 DIAGNOSIS — M940 Chondrocostal junction syndrome [Tietze]: Secondary | ICD-10-CM

## 2015-01-28 MED ORDER — HYDROCOD POLST-CPM POLST ER 10-8 MG/5ML PO SUER: 5.0000 mL | Freq: Two times a day (BID) | ORAL | Status: DC | PRN

## 2015-01-28 MED ORDER — IPRATROPIUM BROMIDE 0.06 % NA SOLN: 2.0000 | Freq: Four times a day (QID) | NASAL | Status: DC

## 2015-01-28 NOTE — ED Notes (Signed)
Pt  Reports      Symptoms   Of     Chest   Pain   sorethroat     With  Cough         X   3  Days         pt  Is  Awake  And   Alert  And  Oriented

## 2015-01-28 NOTE — ED Provider Notes (Signed)
CSN: [id number]     Arrival date & time 01/28/15  1655 History   First MD Initiated Contact with Patient 01/28/15 1720     Chief Complaint  Patient presents with  . Chest Pain   (Consider location/radiation/quality/duration/timing/severity/associated sxs/prior Treatment) Patient is a 25 y.o. male presenting with chest pain. The history is provided by the patient.  Chest Pain Pain location:  R chest Pain quality: sharp   Pain radiates to:  Does not radiate Pain radiates to the back: no   Pain severity:  Mild Onset quality:  Gradual Duration:  3 days Progression:  Unchanged Chronicity:  New Context: movement   Relieved by:  None tried Worsened by:  Nothing tried Ineffective treatments:  None tried Associated symptoms: cough   Associated symptoms: no fever and no shortness of breath   Risk factors: no smoking   Risk factors comment:  Pt states wife with pna, other family sick.   History reviewed. No pertinent past medical history. History reviewed. No pertinent past surgical history. History reviewed. No pertinent family history. Social History  Substance Use Topics  . Smoking status: Current Every Day Smoker  . Smokeless tobacco: None  . Alcohol Use: Yes     Comment: occ    Review of Systems  Constitutional: Negative.  Negative for fever.  HENT: Negative.   Respiratory: Positive for cough. Negative for shortness of breath and wheezing.   Cardiovascular: Positive for chest pain. Negative for leg swelling.  All other systems reviewed and are negative.   Allergies  Nutrasweet aspartame  Home Medications   Prior to Admission medications   Medication Sig Start Date End Date Taking? Authorizing Provider  Aspirin-Acetaminophen-Caffeine (EXCEDRIN PO) Take 2 tablets by mouth 2 (two) times daily as needed (for pain).    Historical Provider, MD  benzonatate (TESSALON) 100 MG capsule Take 1 capsule (100 mg total) by mouth every 8 (eight) hours. 02/22/14   Charm RingsErin J Honig, MD   cetirizine (ZYRTEC) 10 MG tablet Take 10 mg by mouth daily.    Historical Provider, MD  chlorpheniramine-HYDROcodone (TUSSIONEX PENNKINETIC ER) 10-8 MG/5ML SUER Take 5 mLs by mouth every 12 (twelve) hours as needed for cough. 01/28/15   Linna HoffJames D Ridhima Golberg, MD  doxycycline (VIBRAMYCIN) 100 MG capsule Take 1 capsule (100 mg total) by mouth 2 (two) times daily. 02/22/14   Charm RingsErin J Honig, MD  HYDROcodone-acetaminophen (NORCO/VICODIN) 5-325 MG per tablet Take 1-2 tablets by mouth every 6 (six) hours as needed. 08/10/14   Ladona MowJoe Mintz, PA-C  HYDROcodone-homatropine (HYCODAN) 5-1.5 MG/5ML syrup Take 5 mLs by mouth every 6 (six) hours as needed for cough. 02/22/14   Charm RingsErin J Honig, MD  ibuprofen (ADVIL,MOTRIN) 800 MG tablet Take 1 tablet (800 mg total) by mouth 3 (three) times daily. 08/10/14   Ladona MowJoe Mintz, PA-C  ipratropium (ATROVENT) 0.06 % nasal spray Place 2 sprays into both nostrils 4 (four) times daily. 01/28/15   Linna HoffJames D Marchelle Rinella, MD  naproxen (NAPROSYN) 500 MG tablet Take 1 tablet (500 mg total) by mouth 2 (two) times daily. 09/08/13   Antony MaduraKelly Humes, PA-C  ondansetron (ZOFRAN) 4 MG tablet Take 1 tablet (4 mg total) by mouth every 6 (six) hours. 09/08/13   Antony MaduraKelly Humes, PA-C  predniSONE (DELTASONE) 50 MG tablet Take 1 pill daily for 5 days 02/22/14   Charm RingsErin J Honig, MD   Meds Ordered and Administered this Visit  Medications - No data to display  BP 130/70 mmHg  Pulse 80  Temp(Src) 99.3 F (37.4 C) (  Oral)  Resp 18  SpO2 100% No data found.   Physical Exam  Constitutional: He is oriented to person, place, and time. He appears well-developed and well-nourished. No distress.  HENT:  Right Ear: External ear normal.  Left Ear: External ear normal.  Mouth/Throat: Oropharynx is clear and moist.  Neck: Normal range of motion. Neck supple.  Cardiovascular: Regular rhythm, normal heart sounds and intact distal pulses.   Pulmonary/Chest: Effort normal and breath sounds normal. He exhibits tenderness.  Lymphadenopathy:    He has  no cervical adenopathy.  Neurological: He is alert and oriented to person, place, and time.  Skin: Skin is warm.  Nursing note and vitals reviewed.   ED Course  Procedures (including critical care time)  Labs Review Labs Reviewed - No data to display  Imaging Review Dg Chest 2 View  01/28/2015  CLINICAL DATA:  Sneezing, cough and fever for 3 days, sore throat, history of bronchitis EXAM: CHEST  2 VIEW COMPARISON:  02/22/2014 FINDINGS: Cardiomediastinal silhouette is stable. No acute infiltrate or pleural effusion. No pulmonary edema. Bony thorax is unremarkable. IMPRESSION: No active cardiopulmonary disease. Electronically Signed   By: Natasha Mead M.D.   On: 01/28/2015 18:18   X-rays reviewed and report per radiologist.   Visual Acuity Review  Right Eye Distance:   Left Eye Distance:   Bilateral Distance:    Right Eye Near:   Left Eye Near:    Bilateral Near:         MDM   1. URI (upper respiratory infection)   2. Costochondral junction syndrome        Linna Hoff, MD 01/28/15 1836

## 2015-08-03 ENCOUNTER — Encounter (HOSPITAL_COMMUNITY): Payer: Self-pay | Admitting: *Deleted

## 2015-08-03 ENCOUNTER — Ambulatory Visit (INDEPENDENT_AMBULATORY_CARE_PROVIDER_SITE_OTHER): Payer: BLUE CROSS/BLUE SHIELD

## 2015-08-03 ENCOUNTER — Ambulatory Visit (HOSPITAL_COMMUNITY)
Admission: EM | Admit: 2015-08-03 | Discharge: 2015-08-03 | Disposition: A | Discharge status: Home / Self Care | Payer: BLUE CROSS/BLUE SHIELD | Attending: Family Medicine | Admitting: Family Medicine

## 2015-08-03 DIAGNOSIS — S43401A Unspecified sprain of right shoulder joint, initial encounter: Secondary | ICD-10-CM | POA: Diagnosis not present

## 2015-08-03 DIAGNOSIS — S53401A Unspecified sprain of right elbow, initial encounter: Secondary | ICD-10-CM

## 2015-08-03 MED ORDER — NAPROXEN 500 MG PO TABS: 500.0000 mg | ORAL_TABLET | Freq: Two times a day (BID) | ORAL | Status: DC

## 2015-08-03 NOTE — ED Provider Notes (Signed)
CSN: [id number]     Arrival date & time 08/03/15  1807 History   First MD Initiated Contact with Patient 08/03/15 1820     No chief complaint on file.  (Consider location/radiation/quality/duration/timing/severity/associated sxs/prior Treatment) Patient is a 25 y.o. male presenting with wrist injury. The history is provided by the patient.  Wrist Injury Location:  Arm Time since incident:  2 days Injury: yes   Arm location:  R forearm Pain details:    Quality:  Aching   Radiates to:  L wrist   Severity:  Moderate   Onset quality:  Sudden   Duration:  2 days   Timing:  Constant   Progression:  Waxing and waning Handedness:  Right-handed Dislocation: no   Prior injury to area:  No Relieved by:  Nothing Ineffective treatments:  None tried   No past medical history on file. No past surgical history on file. No family history on file. Social History  Substance Use Topics  . Smoking status: Current Every Day Smoker  . Smokeless tobacco: Not on file  . Alcohol Use: Yes     Comment: occ    Review of Systems  Constitutional: Negative.   HENT: Negative.   Eyes: Negative.   Respiratory: Negative.   Cardiovascular: Negative.   Gastrointestinal: Negative.   Endocrine: Negative.   Genitourinary: Negative.   Musculoskeletal: Positive for arthralgias.  Skin: Negative.   Allergic/Immunologic: Negative.   Neurological: Negative.   Hematological: Negative.   Psychiatric/Behavioral: Negative.     Allergies  Nutrasweet aspartame  Home Medications   Prior to Admission medications   Medication Sig Start Date End Date Taking? Authorizing Provider  Aspirin-Acetaminophen-Caffeine (EXCEDRIN PO) Take 2 tablets by mouth 2 (two) times daily as needed (for pain).    Historical Provider, MD  benzonatate (TESSALON) 100 MG capsule Take 1 capsule (100 mg total) by mouth every 8 (eight) hours. 02/22/14   Charm Rings, MD  cetirizine (ZYRTEC) 10 MG tablet Take 10 mg by mouth daily.     Historical Provider, MD  chlorpheniramine-HYDROcodone (TUSSIONEX PENNKINETIC ER) 10-8 MG/5ML SUER Take 5 mLs by mouth every 12 (twelve) hours as needed for cough. 01/28/15   Linna Hoff, MD  doxycycline (VIBRAMYCIN) 100 MG capsule Take 1 capsule (100 mg total) by mouth 2 (two) times daily. 02/22/14   Charm Rings, MD  HYDROcodone-acetaminophen (NORCO/VICODIN) 5-325 MG per tablet Take 1-2 tablets by mouth every 6 (six) hours as needed. 08/10/14   Ladona Mow, PA-C  HYDROcodone-homatropine (HYCODAN) 5-1.5 MG/5ML syrup Take 5 mLs by mouth every 6 (six) hours as needed for cough. 02/22/14   Charm Rings, MD  ibuprofen (ADVIL,MOTRIN) 800 MG tablet Take 1 tablet (800 mg total) by mouth 3 (three) times daily. 08/10/14   Ladona Mow, PA-C  ipratropium (ATROVENT) 0.06 % nasal spray Place 2 sprays into both nostrils 4 (four) times daily. 01/28/15   Linna Hoff, MD  naproxen (NAPROSYN) 500 MG tablet Take 1 tablet (500 mg total) by mouth 2 (two) times daily. 09/08/13   Antony Madura, PA-C  ondansetron (ZOFRAN) 4 MG tablet Take 1 tablet (4 mg total) by mouth every 6 (six) hours. 09/08/13   Antony Madura, PA-C  predniSONE (DELTASONE) 50 MG tablet Take 1 pill daily for 5 days 02/22/14   Charm Rings, MD   Meds Ordered and Administered this Visit  Medications - No data to display  There were no vitals taken for this visit. No data found.   Physical Exam  Constitutional: He appears well-developed and well-nourished.  HENT:  Head: Normocephalic.  Right Ear: External ear normal.  Left Ear: External ear normal.  Mouth/Throat: Oropharynx is clear and moist.  Eyes: Conjunctivae and EOM are normal. Pupils are equal, round, and reactive to light.  Neck: Normal range of motion. Neck supple.  Cardiovascular: Normal rate, regular rhythm and normal heart sounds.   Pulmonary/Chest: Effort normal and breath sounds normal.  Abdominal: Soft. Bowel sounds are normal.  Musculoskeletal: He exhibits tenderness.  TTP right distal ulna  and medial ulna area.    ED Course  Procedures (including critical care time)  Labs Review Labs Reviewed - No data to display  Imaging Review No results found.   Visual Acuity Review  Right Eye Distance:   Left Eye Distance:   Bilateral Distance:    Right Eye Near:   Left Eye Near:    Bilateral Near:         MDM   Right forearm sprain/pain Naprosyn 500mg  one po bid x 10 days #20 Sling right arm. Work excuse for 2 days   Deatra CanterWilliam J Oxford, OregonFNP 08/03/15 [phone]

## 2015-08-03 NOTE — Discharge Instructions (Signed)

## 2015-08-03 NOTE — ED Notes (Signed)
Pt  Reports  About  2  Days  agom he  felle        Going  Down  Some  Steps  And  Injured  His r  Wrist   He  Has  Pain  /  Swelling  To  The   Wrist

## 2015-08-08 ENCOUNTER — Encounter (HOSPITAL_COMMUNITY): Payer: Self-pay | Admitting: *Deleted

## 2015-08-08 ENCOUNTER — Ambulatory Visit (HOSPITAL_COMMUNITY): Payer: BLUE CROSS/BLUE SHIELD

## 2015-08-08 ENCOUNTER — Ambulatory Visit (HOSPITAL_COMMUNITY)
Admission: EM | Admit: 2015-08-08 | Discharge: 2015-08-08 | Disposition: A | Discharge status: Home / Self Care | Payer: BLUE CROSS/BLUE SHIELD | Attending: Family Medicine | Admitting: Family Medicine

## 2015-08-08 DIAGNOSIS — S63501A Unspecified sprain of right wrist, initial encounter: Secondary | ICD-10-CM

## 2015-08-08 DIAGNOSIS — M25531 Pain in right wrist: Secondary | ICD-10-CM | POA: Diagnosis not present

## 2015-08-08 NOTE — ED Triage Notes (Signed)
Today tried to return to work but started to experience intermittent numbness in right hand and severe right wrist pain.

## 2015-08-08 NOTE — ED Triage Notes (Signed)
Pt fell down stairs approx 1 wk ago, trying to catch himself with his right hand.  Was seen in [facility] 7/18 for right wrist pain - neg XR.  Has been wearing sling and had 4 days off work.  Was taking Naproxen, but discontinued due to GI upset.

## 2015-08-08 NOTE — ED Provider Notes (Signed)
[facility]    CSN: [id number] Arrival date & time: 08/08/15  1525  First Provider Contact:  First MD Initiated Contact with Patient 08/08/15 1633        History   Chief Complaint Chief Complaint  Patient presents with  . Wrist Pain  . Numbness    HPI Kiril Hippe is a 25 y.o. male.    Wrist Pain  This is a recurrent problem. The current episode started more than 2 days ago (fell down stairs with right wrist injury and seen 7/18, given sling and oow , rtw today and wrist began to pain after 5 min.). The problem has been gradually worsening. The symptoms are aggravated by twisting.    History reviewed. No pertinent past medical history.  There are no active problems to display for this patient.   Past Surgical History:  Procedure Laterality Date  . WISDOM TOOTH EXTRACTION         Home Medications    Prior to Admission medications   Medication Sig Start Date End Date Taking? Authorizing Provider  naproxen (NAPROSYN) 500 MG tablet Take 1 tablet (500 mg total) by mouth 2 (two) times daily. 09/08/13  Yes Antony Madura, PA-C  Aspirin-Acetaminophen-Caffeine (EXCEDRIN PO) Take 2 tablets by mouth 2 (two) times daily as needed (for pain).    Historical Provider, MD  benzonatate (TESSALON) 100 MG capsule Take 1 capsule (100 mg total) by mouth every 8 (eight) hours. 02/22/14   Charm Rings, MD  cetirizine (ZYRTEC) 10 MG tablet Take 10 mg by mouth daily.    Historical Provider, MD  chlorpheniramine-HYDROcodone (TUSSIONEX PENNKINETIC ER) 10-8 MG/5ML SUER Take 5 mLs by mouth every 12 (twelve) hours as needed for cough. 01/28/15   Linna Hoff, MD  doxycycline (VIBRAMYCIN) 100 MG capsule Take 1 capsule (100 mg total) by mouth 2 (two) times daily. 02/22/14   Charm Rings, MD  HYDROcodone-acetaminophen (NORCO/VICODIN) 5-325 MG per tablet Take 1-2 tablets by mouth every 6 (six) hours as needed. 08/10/14   Ladona Mow, PA-C  HYDROcodone-homatropine (HYCODAN) 5-1.5 MG/5ML syrup  Take 5 mLs by mouth every 6 (six) hours as needed for cough. 02/22/14   Charm Rings, MD  ibuprofen (ADVIL,MOTRIN) 800 MG tablet Take 1 tablet (800 mg total) by mouth 3 (three) times daily. 08/10/14   Ladona Mow, PA-C  ipratropium (ATROVENT) 0.06 % nasal spray Place 2 sprays into both nostrils 4 (four) times daily. 01/28/15   Linna Hoff, MD  naproxen (NAPROSYN) 500 MG tablet Take 1 tablet (500 mg total) by mouth 2 (two) times daily with a meal. 08/03/15   Deatra Canter, FNP  ondansetron (ZOFRAN) 4 MG tablet Take 1 tablet (4 mg total) by mouth every 6 (six) hours. 09/08/13   Antony Madura, PA-C  predniSONE (DELTASONE) 50 MG tablet Take 1 pill daily for 5 days 02/22/14   Charm Rings, MD    Family History No family history on file.  Social History Social History  Substance Use Topics  . Smoking status: Former Games developer  . Smokeless tobacco: Not on file  . Alcohol use No     Allergies   Nutrasweet aspartame [aspartame]   Review of Systems Review of Systems  Constitutional: Negative.   Musculoskeletal: Positive for joint swelling.  All other systems reviewed and are negative.    Physical Exam Triage Vital Signs ED Triage Vitals  Enc Vitals Group     BP 08/08/15 1558 134/89     Pulse Rate  08/08/15 1558 73     Resp 08/08/15 1558 16     Temp 08/08/15 1558 98.1 F (36.7 C)     Temp Source 08/08/15 1558 Oral     SpO2 08/08/15 1558 98 %     Weight --      Height --      Head Circumference --      Peak Flow --      Pain Score 08/08/15 1603 8     Pain Loc --      Pain Edu? --      Excl. in GC? --    No data found.   Updated Vital Signs BP 134/89   Pulse 73   Temp 98.1 F (36.7 C) (Oral)   Resp 16   SpO2 98%   Visual Acuity Right Eye Distance:   Left Eye Distance:   Bilateral Distance:    Right Eye Near:   Left Eye Near:    Bilateral Near:     Physical Exam  Constitutional: He is oriented to person, place, and time. He appears well-developed and well-nourished.    Musculoskeletal: He exhibits tenderness.       Right wrist: He exhibits decreased range of motion, tenderness, bony tenderness and swelling. He exhibits no crepitus and no deformity.  Neurological: He is alert and oriented to person, place, and time.  Skin: Skin is warm and dry.  Nursing note and vitals reviewed.    UC Treatments / Results  Labs (all labs ordered are listed, but only abnormal results are displayed) Labs Reviewed - No data to display  EKG  EKG Interpretation None       Radiology No results found.  Procedures Procedures (including critical care time)  Medications Ordered in UC Medications - No data to display   Initial Impression / Assessment and Plan / UC Course  I have reviewed the triage vital signs and the nursing notes.  Pertinent labs & imaging results that were available during my care of the patient were reviewed by me and considered in my medical decision making (see chart for details).  Clinical Course  Value Comment By Time  DG Wrist Complete Right (Reviewed) Linna Hoff, MD 07/23 1812  DG Wrist Complete Right (Reviewed) Linna Hoff, MD 07/23 1812      Final Clinical Impressions(s) / UC Diagnoses   Final diagnoses:  None    New Prescriptions New Prescriptions   No medications on file     Linna Hoff, MD 08/24/15 2043

## 2015-08-08 NOTE — ED Notes (Signed)
Comfort measures provided ?

## 2015-08-08 NOTE — Discharge Instructions (Signed)
Wear splint for comfort and see orthopedist for recheck.

## 2015-08-17 DIAGNOSIS — M25531 Pain in right wrist: Secondary | ICD-10-CM | POA: Insufficient documentation

## 2015-12-26 ENCOUNTER — Encounter (HOSPITAL_COMMUNITY): Payer: Self-pay | Admitting: *Deleted

## 2015-12-26 ENCOUNTER — Emergency Department (HOSPITAL_COMMUNITY)
Admission: EM | Admit: 2015-12-26 | Discharge: 2015-12-26 | Disposition: A | Discharge status: Home / Self Care | Payer: BLUE CROSS/BLUE SHIELD | Attending: Emergency Medicine | Admitting: Emergency Medicine

## 2015-12-26 DIAGNOSIS — M25531 Pain in right wrist: Secondary | ICD-10-CM | POA: Insufficient documentation

## 2015-12-26 DIAGNOSIS — Z87891 Personal history of nicotine dependence: Secondary | ICD-10-CM | POA: Insufficient documentation

## 2015-12-26 DIAGNOSIS — Y999 Unspecified external cause status: Secondary | ICD-10-CM | POA: Insufficient documentation

## 2015-12-26 DIAGNOSIS — Y9389 Activity, other specified: Secondary | ICD-10-CM | POA: Insufficient documentation

## 2015-12-26 DIAGNOSIS — Y929 Unspecified place or not applicable: Secondary | ICD-10-CM | POA: Insufficient documentation

## 2015-12-26 DIAGNOSIS — X500XXA Overexertion from strenuous movement or load, initial encounter: Secondary | ICD-10-CM | POA: Insufficient documentation

## 2015-12-26 DIAGNOSIS — Z7982 Long term (current) use of aspirin: Secondary | ICD-10-CM | POA: Insufficient documentation

## 2015-12-26 HISTORY — DX: Obesity, unspecified: E66.9

## 2015-12-26 MED ORDER — OXYCODONE-ACETAMINOPHEN 5-325 MG PO TABS: 1.0000 | ORAL_TABLET | Freq: Four times a day (QID) | ORAL | 0 refills | Status: DC | PRN

## 2015-12-26 MED ORDER — PREDNISONE 50 MG PO TABS: 50.0000 mg | ORAL_TABLET | Freq: Every day | ORAL | 0 refills | Status: DC

## 2015-12-26 MED ORDER — OXYCODONE-ACETAMINOPHEN 5-325 MG PO TABS
1.0000 | ORAL_TABLET | Freq: Once | ORAL | Status: AC
  Administered 2015-12-26: 1 via ORAL
  Filled 2015-12-26: qty 1

## 2015-12-26 NOTE — ED Provider Notes (Signed)
[facility] Provider Note   CSN: [id number] Arrival date & time: 12/26/15  1445  By signing my name below, I, Soijett Blue, attest that this documentation has been prepared under the direction and in the presence of Ebbie Ridgehris Loann Chahal, PA-C Electronically Signed: Soijett Blue, ED Scribe. 12/26/15. 3:43 PM.  History   Chief Complaint Chief Complaint  Patient presents with  . Wrist Pain    HPI Phillip Mcdonald is a 25 y.o. male who presents to the Emergency Department complaining of intermittent right wrist pain onset 4 months ago. Pt states that his initial cause of right wrist pain was due to lifting heavy boxes at [employer] 5 months ago. Pt notes that Phillip Mcdonald has been evaluated multiple times at urgent care and seen by an orthopedist for his right wrist pain. Pt states that Phillip Mcdonald was referred for nerve testing by Dr. Mina MarbleWeingold that Phillip Mcdonald missed due to financial reasons and decrease in pain. Pt is having associated symptoms of numbness to right 4th and 5th finger and right thumb pain. Phillip Mcdonald notes that Phillip Mcdonald has tried ibuprofen and tylenol for the relief of his symptoms. Phillip Mcdonald denies swelling, color change, wound, and any other symptoms.   Per pt chart review: [facility] for right wrist pain. Pt had right wrist xray imaging completed with negative results. Pt was referred to orthopedist for further evaluation of his symptoms. Pt was then [facility] by Dr. Mina MarbleWeingold on 08/17/2015. Dr. Mina MarbleWeingold ordered a NCV/EMG/US for the pt right wrist pain to be completed to rule out compression neuropathy. Pt was advised to use advil 800 mg TID with food.    The history is provided by the patient. No language interpreter was used.    Past Medical History:  Diagnosis Date  . Obesity     There are no active problems to display for this patient.   Past Surgical History:  Procedure Laterality Date  . WISDOM TOOTH EXTRACTION         Home Medications    Prior to  Admission medications   Medication Sig Start Date End Date Taking? Authorizing Provider  Aspirin-Acetaminophen-Caffeine (EXCEDRIN PO) Take 2 tablets by mouth 2 (two) times daily as needed (for pain).    Historical Provider, MD  benzonatate (TESSALON) 100 MG capsule Take 1 capsule (100 mg total) by mouth every 8 (eight) hours. 02/22/14   Charm RingsErin J Honig, MD  cetirizine (ZYRTEC) 10 MG tablet Take 10 mg by mouth daily.    Historical Provider, MD  chlorpheniramine-HYDROcodone (TUSSIONEX PENNKINETIC ER) 10-8 MG/5ML SUER Take 5 mLs by mouth every 12 (twelve) hours as needed for cough. 01/28/15   Linna HoffJames D Kindl, MD  doxycycline (VIBRAMYCIN) 100 MG capsule Take 1 capsule (100 mg total) by mouth 2 (two) times daily. 02/22/14   Charm RingsErin J Honig, MD  HYDROcodone-acetaminophen (NORCO/VICODIN) 5-325 MG per tablet Take 1-2 tablets by mouth every 6 (six) hours as needed. 08/10/14   Ladona MowJoe Mintz, PA-C  HYDROcodone-homatropine (HYCODAN) 5-1.5 MG/5ML syrup Take 5 mLs by mouth every 6 (six) hours as needed for cough. 02/22/14   Charm RingsErin J Honig, MD  ibuprofen (ADVIL,MOTRIN) 800 MG tablet Take 1 tablet (800 mg total) by mouth 3 (three) times daily. 08/10/14   Ladona MowJoe Mintz, PA-C  ipratropium (ATROVENT) 0.06 % nasal spray Place 2 sprays into both nostrils 4 (four) times daily. 01/28/15   Linna HoffJames D Kindl, MD  naproxen (NAPROSYN) 500 MG tablet Take 1 tablet (500 mg total) by  mouth 2 (two) times daily. 09/08/13   Antony MaduraKelly Humes, PA-C  naproxen (NAPROSYN) 500 MG tablet Take 1 tablet (500 mg total) by mouth 2 (two) times daily with a meal. 08/03/15   Deatra CanterWilliam J Oxford, FNP  ondansetron (ZOFRAN) 4 MG tablet Take 1 tablet (4 mg total) by mouth every 6 (six) hours. 09/08/13   Antony MaduraKelly Humes, PA-C  predniSONE (DELTASONE) 50 MG tablet Take 1 pill daily for 5 days 02/22/14   Charm RingsErin J Honig, MD    Family History History reviewed. No pertinent family history.  Social History Social History  Substance Use Topics  . Smoking status: Former Games developermoker  . Smokeless tobacco: Not  on file  . Alcohol use No     Allergies   Nutrasweet aspartame [aspartame]   Review of Systems Review of Systems  Musculoskeletal: Positive for arthralgias (right wrist and right thumb). Negative for joint swelling.  Skin: Negative for color change and wound.  Neurological: Positive for numbness (right 4th and 5th finger).     Physical Exam Updated Vital Signs BP 141/81 (BP Location: Left Arm)   Pulse 77   Temp 98 F (36.7 C) (Oral)   Resp 18   SpO2 98%   Physical Exam  Constitutional: Phillip Mcdonald is oriented to person, place, and time. Phillip Mcdonald appears well-developed and well-nourished. No distress.  HENT:  Head: Normocephalic and atraumatic.  Eyes: Pupils are equal, round, and reactive to light.  Pulmonary/Chest: Effort normal.  Musculoskeletal:       Right wrist: Phillip Mcdonald exhibits decreased range of motion (due to pain) and tenderness. Phillip Mcdonald exhibits no swelling.  Pain over lateral dorsal aspect of right wrist. Decreased ROM due to pain. No swelling appreciated.   Neurological: Phillip Mcdonald is alert and oriented to person, place, and time.  Skin: Skin is warm and dry.  Psychiatric: Phillip Mcdonald has a normal mood and affect.  Nursing note and vitals reviewed.   ED Treatments / Results  DIAGNOSTIC STUDIES: Oxygen Saturation is 98% on RA, nl by my interpretation.    COORDINATION OF CARE: 3:41 PM Discussed treatment plan with pt at bedside which includes referral and follow up with orthopedist, steroid Rx, and pt agreed to plan.  Procedures Procedures (including critical care time)  Medications Ordered in ED Medications - No data to display   Initial Impression / Assessment and Plan / ED Course  I have reviewed the triage vital signs and the nursing notes.   Clinical Course     Final Clinical Impressions(s) / ED Diagnoses   Final diagnoses:  None    New Prescriptions New Prescriptions   No medications on file   I personally performed the services described in this documentation, which was  scribed in my presence. The recorded information has been reviewed and is accurate.     Charlestine NightChristopher Jesus Poplin, PA-C 12/26/15 1555    Nelva Nayobert Beaton, MD 12/29/15 1124

## 2015-12-26 NOTE — Discharge Instructions (Signed)
Return here as needed. Follow up with Dr. Mina MarbleWeingold

## 2015-12-26 NOTE — ED Notes (Signed)
Declined W/C at D/C and was escorted to lobby by RN. 

## 2015-12-26 NOTE — ED Triage Notes (Signed)
Pt reports ongoing intermittent right wrist pain for months, pt unsure about any injury. Has brace on pta. Reports pain has increased and become unbearable.

## 2016-05-16 DIAGNOSIS — K37 Unspecified appendicitis: Secondary | ICD-10-CM

## 2016-05-16 HISTORY — DX: Unspecified appendicitis: K37

## 2016-05-24 ENCOUNTER — Emergency Department (HOSPITAL_COMMUNITY): Payer: Self-pay

## 2016-05-24 ENCOUNTER — Ambulatory Visit (HOSPITAL_COMMUNITY)
Admission: EM | Admit: 2016-05-24 | Discharge: 2016-05-26 | Disposition: A | Discharge status: Home / Self Care | Payer: Self-pay | Attending: General Surgery | Admitting: General Surgery

## 2016-05-24 ENCOUNTER — Encounter (HOSPITAL_COMMUNITY): Payer: Self-pay | Admitting: *Deleted

## 2016-05-24 DIAGNOSIS — Z791 Long term (current) use of non-steroidal anti-inflammatories (NSAID): Secondary | ICD-10-CM | POA: Insufficient documentation

## 2016-05-24 DIAGNOSIS — Z79899 Other long term (current) drug therapy: Secondary | ICD-10-CM | POA: Insufficient documentation

## 2016-05-24 DIAGNOSIS — F17200 Nicotine dependence, unspecified, uncomplicated: Secondary | ICD-10-CM | POA: Insufficient documentation

## 2016-05-24 DIAGNOSIS — K352 Acute appendicitis with generalized peritonitis, without abscess: Secondary | ICD-10-CM

## 2016-05-24 DIAGNOSIS — Z7952 Long term (current) use of systemic steroids: Secondary | ICD-10-CM | POA: Insufficient documentation

## 2016-05-24 DIAGNOSIS — Z6838 Body mass index (BMI) 38.0-38.9, adult: Secondary | ICD-10-CM | POA: Insufficient documentation

## 2016-05-24 DIAGNOSIS — E669 Obesity, unspecified: Secondary | ICD-10-CM | POA: Insufficient documentation

## 2016-05-24 DIAGNOSIS — K353 Acute appendicitis with localized peritonitis: Secondary | ICD-10-CM | POA: Insufficient documentation

## 2016-05-24 DIAGNOSIS — K3580 Unspecified acute appendicitis: Secondary | ICD-10-CM | POA: Diagnosis present

## 2016-05-24 DIAGNOSIS — K37 Unspecified appendicitis: Secondary | ICD-10-CM | POA: Diagnosis present

## 2016-05-24 HISTORY — DX: Unspecified appendicitis: K37

## 2016-05-24 LAB — CBC
HEMATOCRIT: 42.9 % (ref 39.0–52.0)
Hemoglobin: 15.4 g/dL (ref 13.0–17.0)
MCH: 30.5 pg (ref 26.0–34.0)
MCHC: 35.9 g/dL (ref 30.0–36.0)
MCV: 85 fL (ref 78.0–100.0)
Platelets: 192 10*3/uL (ref 150–400)
RBC: 5.05 MIL/uL (ref 4.22–5.81)
RDW: 12.1 % (ref 11.5–15.5)
WBC: 9.6 10*3/uL (ref 4.0–10.5)

## 2016-05-24 LAB — URINALYSIS, ROUTINE W REFLEX MICROSCOPIC
Bilirubin Urine: NEGATIVE
GLUCOSE, UA: NEGATIVE mg/dL
Hgb urine dipstick: NEGATIVE
KETONES UR: 5 mg/dL — AB
LEUKOCYTES UA: NEGATIVE
Nitrite: NEGATIVE
PH: 5 (ref 5.0–8.0)
Protein, ur: NEGATIVE mg/dL
Specific Gravity, Urine: 1.03 (ref 1.005–1.030)

## 2016-05-24 LAB — COMPREHENSIVE METABOLIC PANEL
ALBUMIN: 4.4 g/dL (ref 3.5–5.0)
ALT: 33 U/L (ref 17–63)
AST: 37 U/L (ref 15–41)
Alkaline Phosphatase: 78 U/L (ref 38–126)
Anion gap: 11 (ref 5–15)
BILIRUBIN TOTAL: 0.8 mg/dL (ref 0.3–1.2)
BUN: 13 mg/dL (ref 6–20)
CO2: 21 mmol/L — ABNORMAL LOW (ref 22–32)
Calcium: 9.7 mg/dL (ref 8.9–10.3)
Chloride: 109 mmol/L (ref 101–111)
Creatinine, Ser: 1.06 mg/dL (ref 0.61–1.24)
GFR calc Af Amer: >60 mL/min (ref >60)
GFR calc non Af Amer: >60 mL/min (ref >60)
GLUCOSE: 102 mg/dL — AB (ref 65–99)
POTASSIUM: 4 mmol/L (ref 3.5–5.1)
Sodium: 141 mmol/L (ref 135–145)
TOTAL PROTEIN: 6.6 g/dL (ref 6.5–8.1)

## 2016-05-24 LAB — LIPASE, BLOOD: Lipase: 23 U/L (ref 11–51)

## 2016-05-24 MED ORDER — HYDROMORPHONE HCL 1 MG/ML IJ SOLN
0.5000 mg | Freq: Once | INTRAMUSCULAR | Status: AC
  Administered 2016-05-25: 0.5 mg via INTRAVENOUS
  Filled 2016-05-24: qty 1

## 2016-05-24 MED ORDER — ONDANSETRON HCL 4 MG/2ML IJ SOLN
4.0000 mg | Freq: Once | INTRAMUSCULAR | Status: AC
  Administered 2016-05-25: 4 mg via INTRAVENOUS
  Filled 2016-05-24: qty 2

## 2016-05-24 NOTE — ED Provider Notes (Signed)
[facility] Provider Note   CSN: [id number] Arrival date & time: 05/24/16  2121     History   Chief Complaint Chief Complaint  Patient presents with  . Abdominal Pain    HPI Phillip MulletCharles Mcdonald is a 26 y.o. male.  Patient with no contributing medical history presents with epigastric abdominal pain that is sharp, stabbing, radiates into the back and is associated with nausea. No vomiting. Symptoms started around 10:30 this morning and have been progressively worsening throughout the day. He denies similar symptoms in the past. He has taken Pepto Bismol and Tums without relief. He reports eating make the pain worse. No cough, chest pain or SOB, although, he states breathing exacerbates the epigastric pain.   The history is provided by the patient. No language interpreter was used.  Abdominal Pain   Associated symptoms include nausea. Pertinent negatives include fever and vomiting.    Past Medical History:  Diagnosis Date  . Obesity     There are no active problems to display for this patient.   Past Surgical History:  Procedure Laterality Date  . WISDOM TOOTH EXTRACTION         Home Medications    Prior to Admission medications   Medication Sig Start Date End Date Taking? Authorizing Provider  Aspirin-Acetaminophen-Caffeine (EXCEDRIN PO) Take 2 tablets by mouth 2 (two) times daily as needed (for pain).    [provider]  benzonatate (TESSALON) 100 MG capsule Take 1 capsule (100 mg total) by mouth every 8 (eight) hours. 02/22/14   Charm RingsHonig, Erin J, MD  cetirizine (ZYRTEC) 10 MG tablet Take 10 mg by mouth daily.    [provider]  chlorpheniramine-HYDROcodone (TUSSIONEX PENNKINETIC ER) 10-8 MG/5ML SUER Take 5 mLs by mouth every 12 (twelve) hours as needed for cough. 01/28/15   Linna HoffKindl, James D, MD  doxycycline (VIBRAMYCIN) 100 MG capsule Take 1 capsule (100 mg total) by mouth 2 (two) times daily. 02/22/14   Charm RingsHonig, Erin J, MD  HYDROcodone-acetaminophen  (NORCO/VICODIN) 5-325 MG per tablet Take 1-2 tablets by mouth every 6 (six) hours as needed. 08/10/14   Ladona MowMintz, Joe, PA-C  HYDROcodone-homatropine (HYCODAN) 5-1.5 MG/5ML syrup Take 5 mLs by mouth every 6 (six) hours as needed for cough. 02/22/14   Charm RingsHonig, Erin J, MD  ibuprofen (ADVIL,MOTRIN) 800 MG tablet Take 1 tablet (800 mg total) by mouth 3 (three) times daily. 08/10/14   Ladona MowMintz, Joe, PA-C  ipratropium (ATROVENT) 0.06 % nasal spray Place 2 sprays into both nostrils 4 (four) times daily. 01/28/15   Linna HoffKindl, James D, MD  naproxen (NAPROSYN) 500 MG tablet Take 1 tablet (500 mg total) by mouth 2 (two) times daily. 09/08/13   Antony MaduraHumes, Kelly, PA-C  naproxen (NAPROSYN) 500 MG tablet Take 1 tablet (500 mg total) by mouth 2 (two) times daily with a meal. 08/03/15   Oxford, Anselm PancoastWilliam J, FNP  ondansetron (ZOFRAN) 4 MG tablet Take 1 tablet (4 mg total) by mouth every 6 (six) hours. 09/08/13   Antony MaduraHumes, Kelly, PA-C  oxyCODONE-acetaminophen (PERCOCET/ROXICET) 5-325 MG tablet Take 1 tablet by mouth every 6 (six) hours as needed for severe pain. 12/26/15   Lawyer, Cristal Deerhristopher, PA-C  predniSONE (DELTASONE) 50 MG tablet Take 1 tablet (50 mg total) by mouth daily. 12/26/15   Charlestine NightLawyer, Christopher, PA-C    Family History No family history on file.  Social History Social History  Substance Use Topics  . Smoking status: Current Every Day Smoker  . Smokeless tobacco: Never Used  . Alcohol use No  Allergies   Shrimp [shellfish allergy] and Nutrasweet aspartame [aspartame]   Review of Systems Review of Systems  Constitutional: Negative for chills and fever.  Respiratory: Negative.  Negative for cough and shortness of breath.   Cardiovascular: Negative.  Negative for chest pain.  Gastrointestinal: Positive for abdominal pain and nausea. Negative for vomiting.  Musculoskeletal: Negative.   Skin: Negative.   Neurological: Negative.      Physical Exam Updated Vital Signs BP (!) 148/94   Pulse 85   Temp 98.4 F (36.9  C) (Oral)   Resp 18   SpO2 98%   Physical Exam  Constitutional: He is oriented to person, place, and time. He appears well-developed and well-nourished.  HENT:  Head: Normocephalic.  Neck: Normal range of motion. Neck supple.  Cardiovascular: Normal rate and regular rhythm.   No murmur heard. Pulmonary/Chest: Effort normal and breath sounds normal. He has no wheezes. He has no rales. He exhibits no tenderness.  Abdominal: Soft. Bowel sounds are normal. There is tenderness. There is no rebound and no guarding.  Epigastric, RUQ and RLQ tenderness to palpation. Abdomen is nondistended. BS hypoactive throughout.   Musculoskeletal: Normal range of motion.  Neurological: He is alert and oriented to person, place, and time.  Skin: Skin is warm and dry. No rash noted.  Psychiatric: He has a normal mood and affect.     ED Treatments / Results  Labs (all labs ordered are listed, but only abnormal results are displayed) Labs Reviewed  COMPREHENSIVE METABOLIC PANEL - Abnormal; Notable for the following:       Result Value   CO2 21 (*)    Glucose, Bld 102 (*)    All other components within normal limits  URINALYSIS, ROUTINE W REFLEX MICROSCOPIC - Abnormal; Notable for the following:    Ketones, ur 5 (*)    All other components within normal limits  LIPASE, BLOOD  CBC    EKG  EKG Interpretation None       Radiology No results found.  Procedures Procedures (including critical care time)  Medications Ordered in ED Medications  HYDROmorphone (DILAUDID) injection 0.5 mg (not administered)  ondansetron (ZOFRAN) injection 4 mg (not administered)     Initial Impression / Assessment and Plan / ED Course  I have reviewed the triage vital signs and the nursing notes.  Pertinent labs & imaging results that were available during my care of the patient were reviewed by me and considered in my medical decision making (see chart for details).     Patient presents with epigastric  abdominal pain and epigastric/RUQ/RLQ abdominal tenderness. Symptoms x 1 day. No fever. Pain worse with eating. No better with Tums or Pepto Bismol.  Patient is obese and, with worse postprandial pain, consider gall bladder. [place] pending. Labs unremarkable - normal LFT's, no leukocytosis. VSS. Pain and nausea medication ordered.   1:25 - pain is not controlled with IV Dilaudid. Nausea persists - no vomiting. Abdominal US inconclusive:  1. Gallbladder not well assessed due to the patient's habitus. It is contracted, without definite evidence of gallbladder wall thickening or pericholecystic fluid. No definite stones seen. However, a positive ultrasonographic Murphy's sign is elicited, of uncertain significance. Would correlate with LFTs.  LFT's normal, no leukocytosis, VSS without fever. GI cocktail ordered but anticipate needing CT scan due to ongoing significant pain.   CT scan shows evidence of early acute appendicitis. Discussed with Dr. Luisa Hart who will review CT and evaluate the patient for admission  6:30 - Patient  has been admitted by general surgery.   Final Clinical Impressions(s) / ED Diagnoses   Final diagnoses:  None   1. Acute Appendicitis 2. Abdominal pain  New Prescriptions New Prescriptions   No medications on file     Elpidio Anis, Cordelia Poche 05/25/16 4010    Dione Booze, MD 05/25/16 [phone]

## 2016-05-24 NOTE — ED Triage Notes (Signed)
Pt c/o epigastric pain with NV since 11am . Pain radiates into back and is intermittent

## 2016-05-25 ENCOUNTER — Emergency Department (HOSPITAL_COMMUNITY): Payer: Self-pay

## 2016-05-25 ENCOUNTER — Observation Stay (HOSPITAL_COMMUNITY): Payer: Self-pay | Admitting: Certified Registered Nurse Anesthetist

## 2016-05-25 ENCOUNTER — Encounter (HOSPITAL_COMMUNITY): Payer: Self-pay | Admitting: General Practice

## 2016-05-25 ENCOUNTER — Encounter (HOSPITAL_COMMUNITY)
Admission: EM | Disposition: A | Discharge status: Home / Self Care | Payer: Self-pay | Source: Home / Self Care | Attending: Emergency Medicine

## 2016-05-25 DIAGNOSIS — K3580 Unspecified acute appendicitis: Secondary | ICD-10-CM | POA: Diagnosis present

## 2016-05-25 DIAGNOSIS — K37 Unspecified appendicitis: Secondary | ICD-10-CM | POA: Diagnosis present

## 2016-05-25 HISTORY — PX: LAPAROSCOPIC APPENDECTOMY: SHX408

## 2016-05-25 LAB — CBC
HCT: 43.2 % (ref 39.0–52.0)
HEMOGLOBIN: 15 g/dL (ref 13.0–17.0)
MCH: 29.6 pg (ref 26.0–34.0)
MCHC: 34.7 g/dL (ref 30.0–36.0)
MCV: 85.4 fL (ref 78.0–100.0)
PLATELETS: 177 10*3/uL (ref 150–400)
RBC: 5.06 MIL/uL (ref 4.22–5.81)
RDW: 12.2 % (ref 11.5–15.5)
WBC: 6.4 10*3/uL (ref 4.0–10.5)

## 2016-05-25 LAB — CREATININE, SERUM
Creatinine, Ser: 0.96 mg/dL (ref 0.61–1.24)
GFR calc Af Amer: >60 mL/min (ref >60)

## 2016-05-25 LAB — SURGICAL PCR SCREEN
MRSA, PCR: NEGATIVE
STAPHYLOCOCCUS AUREUS: NEGATIVE

## 2016-05-25 LAB — HIV ANTIBODY (ROUTINE TESTING W REFLEX): HIV SCREEN 4TH GENERATION: NONREACTIVE

## 2016-05-25 SURGERY — APPENDECTOMY, LAPAROSCOPIC
Anesthesia: General | Site: Abdomen

## 2016-05-25 MED ORDER — SODIUM CHLORIDE 0.9 % IR SOLN
Status: DC | PRN
  Administered 2016-05-25: 1000 mL

## 2016-05-25 MED ORDER — FENTANYL CITRATE (PF) 250 MCG/5ML IJ SOLN
INTRAMUSCULAR | Status: AC
  Filled 2016-05-25: qty 5

## 2016-05-25 MED ORDER — PROPOFOL 10 MG/ML IV BOLUS
INTRAVENOUS | Status: DC | PRN
  Administered 2016-05-25: 120 mg via INTRAVENOUS

## 2016-05-25 MED ORDER — HYDROMORPHONE HCL 1 MG/ML IJ SOLN
INTRAMUSCULAR | Status: AC
  Filled 2016-05-25: qty 1

## 2016-05-25 MED ORDER — ENOXAPARIN SODIUM 40 MG/0.4ML ~~LOC~~ SOLN: 40.0000 mg | SUBCUTANEOUS | Status: DC

## 2016-05-25 MED ORDER — SUCCINYLCHOLINE CHLORIDE 200 MG/10ML IV SOSY
PREFILLED_SYRINGE | INTRAVENOUS | Status: AC
  Filled 2016-05-25: qty 10

## 2016-05-25 MED ORDER — KCL IN DEXTROSE-NACL 20-5-0.45 MEQ/L-%-% IV SOLN
INTRAVENOUS | Status: AC
Start: 2016-05-25 — End: 2016-05-25
  Filled 2016-05-25: qty 1000

## 2016-05-25 MED ORDER — IOPAMIDOL (ISOVUE-300) INJECTION 61%
INTRAVENOUS | Status: AC
  Administered 2016-05-25: 100 mL
  Filled 2016-05-25: qty 100

## 2016-05-25 MED ORDER — DEXTROSE 5 % IV SOLN: 2.0000 g | INTRAVENOUS | Status: DC

## 2016-05-25 MED ORDER — GI COCKTAIL ~~LOC~~
30.0000 mL | Freq: Once | ORAL | Status: AC
  Administered 2016-05-25: 30 mL via ORAL
  Filled 2016-05-25: qty 30

## 2016-05-25 MED ORDER — MIDAZOLAM HCL 2 MG/2ML IJ SOLN
INTRAMUSCULAR | Status: AC
  Filled 2016-05-25: qty 2

## 2016-05-25 MED ORDER — ONDANSETRON HCL 4 MG/2ML IJ SOLN: 4.0000 mg | Freq: Four times a day (QID) | INTRAMUSCULAR | Status: DC | PRN

## 2016-05-25 MED ORDER — KCL IN DEXTROSE-NACL 20-5-0.9 MEQ/L-%-% IV SOLN
INTRAVENOUS | Status: DC
  Administered 2016-05-25: 18:00:00 via INTRAVENOUS
  Filled 2016-05-25: qty 1000

## 2016-05-25 MED ORDER — BUPIVACAINE HCL (PF) 0.25 % IJ SOLN
INTRAMUSCULAR | Status: DC | PRN
  Administered 2016-05-25: 20 mL

## 2016-05-25 MED ORDER — MIDAZOLAM HCL 2 MG/2ML IJ SOLN
INTRAMUSCULAR | Status: DC | PRN
  Administered 2016-05-25: 2 mg via INTRAVENOUS

## 2016-05-25 MED ORDER — 0.9 % SODIUM CHLORIDE (POUR BTL) OPTIME
TOPICAL | Status: DC | PRN
  Administered 2016-05-25: 1000 mL

## 2016-05-25 MED ORDER — ROCURONIUM BROMIDE 100 MG/10ML IV SOLN
INTRAVENOUS | Status: DC | PRN
  Administered 2016-05-25: 50 mg via INTRAVENOUS

## 2016-05-25 MED ORDER — MORPHINE SULFATE (PF) 4 MG/ML IV SOLN
1.0000 mg | INTRAVENOUS | Status: DC | PRN
  Administered 2016-05-25 (×2): 4 mg via INTRAVENOUS
  Filled 2016-05-25 (×2): qty 1

## 2016-05-25 MED ORDER — SUCCINYLCHOLINE CHLORIDE 20 MG/ML IJ SOLN
INTRAMUSCULAR | Status: DC | PRN
  Administered 2016-05-25: 120 mg via INTRAVENOUS

## 2016-05-25 MED ORDER — METRONIDAZOLE IN NACL 5-0.79 MG/ML-% IV SOLN
500.0000 mg | Freq: Three times a day (TID) | INTRAVENOUS | Status: DC
  Administered 2016-05-25: 500 mg via INTRAVENOUS
  Filled 2016-05-25: qty 100

## 2016-05-25 MED ORDER — DEXTROSE IN LACTATED RINGERS 5 % IV SOLN
INTRAVENOUS | Status: DC
  Administered 2016-05-25: 09:00:00 via INTRAVENOUS

## 2016-05-25 MED ORDER — HYDROCODONE-ACETAMINOPHEN 5-325 MG PO TABS
1.0000 | ORAL_TABLET | ORAL | Status: DC | PRN
Start: 2016-05-25 — End: 2016-05-26
  Administered 2016-05-26: 2 via ORAL
  Filled 2016-05-25 (×3): qty 2

## 2016-05-25 MED ORDER — EPHEDRINE 5 MG/ML INJ
INTRAVENOUS | Status: AC
  Filled 2016-05-25: qty 10

## 2016-05-25 MED ORDER — KCL IN DEXTROSE-NACL 20-5-0.45 MEQ/L-%-% IV SOLN
INTRAVENOUS | Status: DC
  Administered 2016-05-25: 14:00:00 via INTRAVENOUS

## 2016-05-25 MED ORDER — PROPOFOL 10 MG/ML IV BOLUS
INTRAVENOUS | Status: AC
  Filled 2016-05-25: qty 20

## 2016-05-25 MED ORDER — DEXTROSE 5 % IV SOLN
2.0000 g | INTRAVENOUS | Status: DC
  Administered 2016-05-25: 2 g via INTRAVENOUS
  Filled 2016-05-25: qty 2

## 2016-05-25 MED ORDER — HYDROMORPHONE HCL 1 MG/ML IJ SOLN
1.0000 mg | INTRAMUSCULAR | Status: DC | PRN
  Administered 2016-05-25 (×2): 1 mg via INTRAVENOUS
  Filled 2016-05-25 (×2): qty 1

## 2016-05-25 MED ORDER — PANTOPRAZOLE SODIUM 40 MG IV SOLR
40.0000 mg | Freq: Every day | INTRAVENOUS | Status: DC
  Administered 2016-05-25: 40 mg via INTRAVENOUS
  Filled 2016-05-25: qty 40

## 2016-05-25 MED ORDER — HEPARIN SODIUM (PORCINE) 5000 UNIT/ML IJ SOLN
5000.0000 [IU] | Freq: Three times a day (TID) | INTRAMUSCULAR | Status: DC
  Filled 2016-05-25: qty 1

## 2016-05-25 MED ORDER — METRONIDAZOLE IN NACL 5-0.79 MG/ML-% IV SOLN
500.0000 mg | Freq: Three times a day (TID) | INTRAVENOUS | Status: DC
  Administered 2016-05-25: 500 mg via INTRAVENOUS
  Filled 2016-05-25 (×2): qty 100

## 2016-05-25 MED ORDER — BUPIVACAINE HCL (PF) 0.25 % IJ SOLN
INTRAMUSCULAR | Status: AC
  Filled 2016-05-25: qty 30

## 2016-05-25 MED ORDER — ONDANSETRON HCL 4 MG/2ML IJ SOLN
INTRAMUSCULAR | Status: AC
  Filled 2016-05-25: qty 2

## 2016-05-25 MED ORDER — ONDANSETRON 4 MG PO TBDP: 4.0000 mg | ORAL_TABLET | Freq: Four times a day (QID) | ORAL | Status: DC | PRN

## 2016-05-25 MED ORDER — LACTATED RINGERS IV SOLN
INTRAVENOUS | Status: DC
  Administered 2016-05-25: 11:00:00 via INTRAVENOUS

## 2016-05-25 MED ORDER — FENTANYL CITRATE (PF) 100 MCG/2ML IJ SOLN
INTRAMUSCULAR | Status: DC | PRN
  Administered 2016-05-25: 50 ug via INTRAVENOUS
  Administered 2016-05-25: 150 ug via INTRAVENOUS
  Administered 2016-05-25 (×2): 50 ug via INTRAVENOUS

## 2016-05-25 MED ORDER — SUGAMMADEX SODIUM 200 MG/2ML IV SOLN
INTRAVENOUS | Status: DC | PRN
  Administered 2016-05-25: 200 mg via INTRAVENOUS

## 2016-05-25 MED ORDER — ONDANSETRON HCL 4 MG/2ML IJ SOLN
INTRAMUSCULAR | Status: DC | PRN
  Administered 2016-05-25: 4 mg via INTRAVENOUS

## 2016-05-25 SURGICAL SUPPLY — 33 items
APPLIER CLIP ROT 10 11.4 M/L (STAPLE) ×2
BLADE CLIPPER SURG (BLADE) ×2 IMPLANT
CANISTER SUCT 3000ML PPV (MISCELLANEOUS) ×2 IMPLANT
CHLORAPREP W/TINT 26ML (MISCELLANEOUS) ×2 IMPLANT
CLIP APPLIE ROT 10 11.4 M/L (STAPLE) ×1 IMPLANT
COVER SURGICAL LIGHT HANDLE (MISCELLANEOUS) ×2 IMPLANT
CUTTER FLEX LINEAR 45M (STAPLE) ×2 IMPLANT
DERMABOND ADVANCED (GAUZE/BANDAGES/DRESSINGS) ×1
DERMABOND ADVANCED .7 DNX12 (GAUZE/BANDAGES/DRESSINGS) ×1 IMPLANT
ELECT REM PT RETURN 9FT ADLT (ELECTROSURGICAL) ×2
ELECTRODE REM PT RTRN 9FT ADLT (ELECTROSURGICAL) ×1 IMPLANT
GLOVE BIO SURGEON STRL SZ7.5 (GLOVE) ×2 IMPLANT
GLOVE ECLIPSE 8.0 STRL XLNG CF (GLOVE) ×2 IMPLANT
GOWN STRL REUS W/ TWL LRG LVL3 (GOWN DISPOSABLE) ×1 IMPLANT
GOWN STRL REUS W/ TWL XL LVL3 (GOWN DISPOSABLE) ×1 IMPLANT
GOWN STRL REUS W/TWL LRG LVL3 (GOWN DISPOSABLE) ×1
GOWN STRL REUS W/TWL XL LVL3 (GOWN DISPOSABLE) ×1
KIT BASIN OR (CUSTOM PROCEDURE TRAY) ×2 IMPLANT
KIT ROOM TURNOVER OR (KITS) ×2 IMPLANT
NS IRRIG 1000ML POUR BTL (IV SOLUTION) ×2 IMPLANT
PAD ARMBOARD 7.5X6 YLW CONV (MISCELLANEOUS) ×2 IMPLANT
POUCH SPECIMEN RETRIEVAL 10MM (ENDOMECHANICALS) ×2 IMPLANT
RELOAD STAPLE TA45 3.5 REG BLU (ENDOMECHANICALS) ×2 IMPLANT
SET IRRIG TUBING LAPAROSCOPIC (IRRIGATION / IRRIGATOR) ×2 IMPLANT
SHEARS HARMONIC ACE PLUS 36CM (ENDOMECHANICALS) ×2 IMPLANT
SPECIMEN JAR SMALL (MISCELLANEOUS) ×2 IMPLANT
SUT MNCRL AB 4-0 PS2 18 (SUTURE) ×2 IMPLANT
TOWEL OR 17X24 6PK STRL BLUE (TOWEL DISPOSABLE) ×2 IMPLANT
TRAY FOLEY CATH SILVER 16FR (SET/KITS/TRAYS/PACK) ×2 IMPLANT
TRAY LAPAROSCOPIC MC (CUSTOM PROCEDURE TRAY) ×2 IMPLANT
TROCAR XCEL BLUNT TIP 100MML (ENDOMECHANICALS) ×2 IMPLANT
TROCAR XCEL NON-BLD 5MMX100MML (ENDOMECHANICALS) ×4 IMPLANT
TUBING INSUFFLATION (TUBING) ×2 IMPLANT

## 2016-05-25 NOTE — Discharge Instructions (Signed)
Your appointment is at 1:30 PM on 06/20/16 , please arrive at least 30 min before your appointment to complete your check in paperwork.  If you are unable to arrive 30 min prior to your appointment time we may have to cancel or reschedule you.  LAPAROSCOPIC SURGERY: POST OP INSTRUCTIONS  1. DIET: Follow a light bland diet the first 24 hours after arrival home, such as soup, liquids, crackers, etc. Be sure to include lots of fluids daily. Avoid fast food or heavy meals as your are more likely to get nauseated. Eat a low fat the next few days after surgery.  2. Take your usually prescribed home medications unless otherwise directed. 3. PAIN CONTROL:  1. Pain is best controlled by a usual combination of three different methods TOGETHER:  1. Ice/Heat 2. Over the counter pain medication 3. Prescription pain medication 2. Most patients will experience some swelling and bruising around the incisions. Ice packs or heating pads (30-60 minutes up to 6 times a day) will help. Use ice for the first few days to help decrease swelling and bruising, then switch to heat to help relax tight/sore spots and speed recovery. Some people prefer to use ice alone, heat alone, alternating between ice & heat. Experiment to what works for you. Swelling and bruising can take several weeks to resolve.  3. It is helpful to take an over-the-counter pain medication regularly for the first few weeks. Choose one of the following that works best for you:  1. Naproxen (Aleve, etc) Two 220mg  tabs twice a day 2. Ibuprofen (Advil, etc) Three 200mg  tabs four times a day (every meal & bedtime) 3. Acetaminophen (Tylenol, etc) 500-650mg  four times a day (every meal & bedtime) 4. A prescription for pain medication (such as oxycodone, hydrocodone, etc) should be given to you upon discharge. Take your pain medication as prescribed.  1. If you are having problems/concerns with the prescription medicine (does not control pain, nausea, vomiting,  rash, itching, etc), please call us [phone] to see if we need to switch you to a different pain medicine that will work better for you and/or control your side effect better. 2. If you need a refill on your pain medication, please contact your pharmacy. They will contact our office to request authorization. Prescriptions will not be filled after 5 pm or on week-ends. 4. Avoid getting constipated. Between the surgery and the pain medications, it is common to experience some constipation. Increasing fluid intake and taking a fiber supplement (such as Metamucil, Citrucel, FiberCon, MiraLax, etc) 1-2 times a day regularly will usually help prevent this problem from occurring. A mild laxative (prune juice, Milk of Magnesia, MiraLax, etc) should be taken according to package directions if there are no bowel movements after 48 hours.  5. Watch out for diarrhea. If you have many loose bowel movements, simplify your diet to bland foods & liquids for a few days. Stop any stool softeners and decrease your fiber supplement. Switching to mild anti-diarrheal medications (Kayopectate, Pepto Bismol) can help. If this worsens or does not improve, please call us. 6. Wash / shower every day. You may shower over the dressings as they are waterproof. Continue to shower over incision(s) after the dressing is off. 7. Remove your waterproof bandages 5 days after surgery. You may leave the incision open to air. You may replace a dressing/Band-Aid to cover the incision for comfort if you wish.  8. ACTIVITIES as tolerated:  1. You may resume regular (light) daily activities beginning  the next day--such as daily self-care, walking, climbing stairs--gradually increasing activities as tolerated. If you can walk 30 minutes without difficulty, it is safe to try more intense activity such as jogging, treadmill, bicycling, low-impact aerobics, swimming, etc. 2. Save the most intensive and strenuous activity for last such as sit-ups,  heavy lifting, contact sports, etc Refrain from any heavy lifting or straining until you are off narcotics for pain control.  3. DO NOT PUSH THROUGH PAIN. Let pain be your guide: If it hurts to do something, don't do it. Pain is your body warning you to avoid that activity for another week until the pain goes down. 4. You may drive when you are no longer taking prescription pain medication, you can comfortably wear a seatbelt, and you can safely maneuver your car and apply brakes. 5. You may have sexual intercourse when it is comfortable.  9. FOLLOW UP in our office  1. Please call [facility] at [phone] to set up an appointment to see your surgeon in the office for a follow-up appointment approximately 2-3 weeks after your surgery. 2. Make sure that you call for this appointment the day you arrive home to insure a convenient appointment time.      10. IF YOU HAVE DISABILITY OR FAMILY LEAVE FORMS, BRING THEM TO THE               OFFICE FOR PROCESSING.   WHEN TO CALL us [phone]:  1. Poor pain control 2. Reactions / problems with new medications (rash/itching, nausea, etc)  2. Fever over 101.5 F (38.5 C) 3. Inability to urinate 4. Nausea and/or vomiting 5. Worsening swelling or bruising 6. Continued bleeding from incision. 7. Increased pain, redness, or drainage from the incision  The clinic staff is available to answer your questions during regular business hours (8:30am-5pm). Please dont hesitate to call and ask to speak to one of our nurses for clinical concerns.  If you have a medical emergency, go to the nearest emergency room or call 911.  A surgeon from [facility] is always on call at the [facility], [place]  [address], [place], [place] [address] ?  MAIN: [phone] ? TOLL FREE: [phone] ?  FAX [phone]  [email or web address]    Laparoscopic Appendectomy, Adult, Care After These instructions  give you information about caring for yourself after your procedure. Your doctor may also give you more specific instructions. Call your doctor if you have any problems or questions after your procedure. Follow these instructions at home: Medicines   Take over-the-counter and prescription medicines only as told by your doctor.  Do not drive for 24 hours if you received a sedative.  Do not drive or use heavy machinery while taking prescription pain medicine.  If you were prescribed an antibiotic medicine, take it as told by your doctor. Do not stop taking it even if you start to feel better. Activity   Do not lift anything that is heavier than 10 pounds (4.5 kg) for 3 weeks or as told by your doctor.  Do not play contact sports for 3 weeks or as told by your doctor.  Slowly return to your normal activities. Bathing   Keep your cuts from surgery (incisions) clean and dry.  Gently wash the cuts with soap and water.  Rinse the cuts with water until the soap is gone.  Pat the cuts dry with a clean towel. Do not rub the cuts.  You may take showers after 48 hours.  Do not take baths, swim, or use a hot tub for 2 weeks or as told by your doctor. Cut Care   Follow instructions from your doctor about how to take care of your cuts. Make sure you:  Wash your hands with soap and water before you change your bandage (dressing). If you do not have soap and water, use hand sanitizer.  Change your bandage as told by your doctor.  Leave stitches (sutures), skin glue, or skin tape (adhesive) strips in place. They may need to stay in place for 2 weeks or longer. If tape strips get loose and curl up, you may trim the loose edges. Do not remove tape strips completely unless your doctor says it is okay.  Check your cuts every day for signs of infection. Check for:  More redness, swelling, or pain.  More fluid or blood.  Warmth.  Pus or a bad smell. Other Instructions   If you were sent  home with a drain, follow instructions from your doctor about how to use it and care for it.  Take deep breaths. This helps to keep your lungs from getting swollen (inflamed).  To help with constipation:  Drink plenty of fluids.  Eat plenty of fruits and vegetables.  Keep all follow-up visits as told by your doctor. This is important. Contact a doctor if:  You have more redness, swelling, or pain around a cut from surgery.  You have more fluid or blood coming from a cut.  Your cut feels warm to the touch.  You have pus or a bad smell coming from a cut or a bandage.  The edges of a cut break open after the stitches have been taken out.  You have pain in your shoulders that gets worse.  You feel dizzy or you pass out (faint).  You have shortness of breath.  You keep feeling sick to your stomach (nauseous).  You keep throwing up (vomiting).  You get diarrhea or you cannot control your poop.  You lose your appetite.  You have swelling or pain in your legs. Get help right away if:  You have a fever.  You get a rash.  You have trouble breathing.  You have sharp pains in your chest. This information is not intended to replace advice given to you by your health care provider. Make sure you discuss any questions you have with your health care provider. Document Released: 10/29/2008 Document Revised: 06/10/2015 Document Reviewed: 06/22/2014 Elsevier Interactive Patient Education  2017 [employer].

## 2016-05-25 NOTE — Transfer of Care (Signed)
Immediate Anesthesia Transfer of Care Note  Patient: Phillip MulletCharles Seib  Procedure(s) Performed: Procedure(s): APPENDECTOMY LAPAROSCOPIC (N/A)  Patient Location: PACU  Anesthesia Type:General  Level of Consciousness: awake, alert , oriented and patient cooperative  Airway & Oxygen Therapy: Patient Spontanous Breathing and Patient connected to nasal cannula oxygen  Post-op Assessment: Report given to RN, Post -op Vital signs reviewed and stable and Patient moving all extremities X 4  Post vital signs: Reviewed and stable  Last Vitals:  Vitals:   05/25/16 1049 05/25/16 1334  BP: 110/64 140/86  Pulse: (!) 58 83  Resp: 19 16  Temp: 36.9 C 36.9 C    Last Pain:  Vitals:   05/25/16 1334  TempSrc:   PainSc: Asleep      Patients Stated Pain Goal: 2 (05/25/16 1033)  Complications: No apparent anesthesia complications

## 2016-05-25 NOTE — H&P (Signed)
Phillip Mcdonald is an 26 y.o. male.   Chief Complaint: Abdominal pain HPI: Patient seen in the emergency room for abdominal pain. Patient has 1 day history of abdominal pain. It started yesterday about 10:00 in the morning. This progressed overnight. The pain location was his upper abdomen but now is in his right lower quadrant of his abdomen. Pain is severe made worse with movement. He also has nausea but no vomiting. Scan was obtained which showed acute appendicitis. Still has significant severe pain is right lower quadrant. This is probably a 10 sharp in nature when pushed.  Past Medical History:  Diagnosis Date  . Obesity     Past Surgical History:  Procedure Laterality Date  . WISDOM TOOTH EXTRACTION      No family history on file. Social History:  reports that he has been smoking.  He has never used smokeless tobacco. He reports that he does not drink alcohol or use drugs.  Allergies:  Allergies  Allergen Reactions  . Shrimp [Shellfish Allergy]   . Nutrasweet Aspartame [Aspartame] Other (See Comments)    Unknown     (Not in a hospital admission)  Results for orders placed or performed during the hospital encounter of 05/24/16 (from the past 48 hour(s))  Urinalysis, Routine w reflex microscopic     Status: Abnormal   Collection Time: 05/24/16  9:29 PM  Result Value Ref Range   Color, Urine YELLOW YELLOW   APPearance CLEAR CLEAR   Specific Gravity, Urine 1.030 1.005 - 1.030   pH 5.0 5.0 - 8.0   Glucose, UA NEGATIVE NEGATIVE mg/dL   Hgb urine dipstick NEGATIVE NEGATIVE   Bilirubin Urine NEGATIVE NEGATIVE   Ketones, ur 5 (A) NEGATIVE mg/dL   Protein, ur NEGATIVE NEGATIVE mg/dL   Nitrite NEGATIVE NEGATIVE   Leukocytes, UA NEGATIVE NEGATIVE  Lipase, blood     Status: None   Collection Time: 05/24/16  9:38 PM  Result Value Ref Range   Lipase 23 11 - 51 U/L  Comprehensive metabolic panel     Status: Abnormal   Collection Time: 05/24/16  9:38 PM  Result Value Ref Range    Sodium 141 135 - 145 mmol/L   Potassium 4.0 3.5 - 5.1 mmol/L   Chloride 109 101 - 111 mmol/L   CO2 21 (L) 22 - 32 mmol/L   Glucose, Bld 102 (H) 65 - 99 mg/dL   BUN 13 6 - 20 mg/dL   Creatinine, Ser 1.06 0.61 - 1.24 mg/dL   Calcium 9.7 8.9 - 10.3 mg/dL   Total Protein 6.6 6.5 - 8.1 g/dL   Albumin 4.4 3.5 - 5.0 g/dL   AST 37 15 - 41 U/L   ALT 33 17 - 63 U/L   Alkaline Phosphatase 78 38 - 126 U/L   Total Bilirubin 0.8 0.3 - 1.2 mg/dL   GFR calc non Af Amer >60 >60 mL/min   GFR calc Af Amer >60 >60 mL/min    Comment: (NOTE) The eGFR has been calculated using the CKD EPI equation. This calculation has not been validated in all clinical situations. eGFR's persistently <60 mL/min signify possible Chronic Kidney Disease.    Anion gap 11 5 - 15  CBC     Status: None   Collection Time: 05/24/16  9:38 PM  Result Value Ref Range   WBC 9.6 4.0 - 10.5 K/uL   RBC 5.05 4.22 - 5.81 MIL/uL   Hemoglobin 15.4 13.0 - 17.0 g/dL   HCT 42.9 39.0 - 52.0 %  MCV 85.0 78.0 - 100.0 fL   MCH 30.5 26.0 - 34.0 pg   MCHC 35.9 30.0 - 36.0 g/dL   RDW 12.1 11.5 - 15.5 %   Platelets 192 150 - 400 K/uL   Ct Abdomen Pelvis W Contrast  Result Date: 05/25/2016 CLINICAL DATA:  Upper abdominal pain. EXAM: CT ABDOMEN AND PELVIS WITH CONTRAST TECHNIQUE: Multidetector CT imaging of the abdomen and pelvis was performed using the standard protocol following bolus administration of intravenous contrast. CONTRAST:  170m ISOVUE-300 IOPAMIDOL (ISOVUE-300) INJECTION 61% COMPARISON:  Right upper quadrant ultrasound 05/25/2016 FINDINGS: Lower chest: Mild dependent changes in the lung bases. Tiny subpleural nodules in the right lung base measuring less than 5 mm, likely small lymph nodes. Hepatobiliary: Mild diffuse fatty infiltration. No focal liver lesions. Gallbladder and bile ducts are unremarkable. Pancreas: Unremarkable. No pancreatic ductal dilatation or surrounding inflammatory changes. Spleen: Normal in size without  focal abnormality. Adrenals/Urinary Tract: Adrenal glands are unremarkable. Kidneys are normal, without renal calculi, focal lesion, or hydronephrosis. Bladder is unremarkable. Stomach/Bowel: The appendix is mildly distended at 11 mm. There is mild infiltration around the appendix. Changes are consistent with early acute appendicitis. No abscess. Stomach, small bowel, and colon are not abnormally distended. No wall thickening is appreciated. Vascular/Lymphatic: No significant vascular findings are present. No enlarged abdominal or pelvic lymph nodes. Reproductive: Prostate gland is enlarged at 4.4 cm diameter. Other: No abdominal wall hernia or abnormality. No abdominopelvic ascites. Musculoskeletal: No acute or significant osseous findings. IMPRESSION: Distended appendix with infiltration in the fat around the appendix consistent with early acute appendicitis. No abscess. Electronically Signed   By: WLucienne CapersM.D.   On: 05/25/2016 03:53   [place]dom[place] Limited  Result Date: 05/25/2016 CLINICAL DATA:  Acute onset of epigastric abdominal pain. Initial encounter. EXAM: [place] LIMITED - RIGHT UPPER QUADRANT COMPARISON:  Abdominal ultrasound performed 12/13/2012 FINDINGS: Gallbladder: Contracted and not well characterized. Evaluation is suboptimal due to the patient's habitus. No definite stones are seen. No gallbladder wall thickening or pericholecystic fluid is appreciated. However, a positive ultrasonographic Murphy's sign is elicited. Common bile duct: Diameter: 0.3 cm, within normal limits in caliber. Liver: No focal lesion identified. Diffusely increased parenchymal echogenicity and coarsened echotexture, compatible with fatty infiltration. IMPRESSION: 1. Gallbladder not well assessed due to the patient's habitus. It is contracted, without definite evidence of gallbladder wall thickening or pericholecystic fluid. No definite stones seen. However, a positive ultrasonographic Murphy's sign is elicited,  of uncertain significance. Would correlate with LFTs. 2. Diffuse fatty infiltration within the liver. Electronically Signed   By: JGarald BaldingM.D.   On: 05/25/2016 00:41    Review of Systems  Constitutional: Positive for malaise/fatigue. Negative for chills and fever.  HENT: Negative for hearing loss and tinnitus.   Eyes: Negative for blurred vision and double vision.  Respiratory: Negative for cough and hemoptysis.   Cardiovascular: Negative for chest pain and palpitations.  Gastrointestinal: Negative for heartburn and nausea.  Genitourinary: Negative for dysuria and urgency.  Musculoskeletal: Negative for myalgias and neck pain.  Skin: Negative for itching and rash.  Neurological: Negative for dizziness and headaches.  Endo/Heme/Allergies: Negative for environmental allergies. Does not bruise/bleed easily.  Psychiatric/Behavioral: Negative for depression and suicidal ideas.    Blood pressure 122/87, pulse 76, temperature 98.4 F (36.9 C), temperature source Oral, resp. rate 18, SpO2 99 %. Physical Exam  Constitutional: He is oriented to person, place, and time. He appears well-developed and well-nourished.  HENT:  Head: Normocephalic and  atraumatic.  Eyes: EOM are normal. Pupils are equal, round, and reactive to light. No scleral icterus.  Neck: Normal range of motion. Neck supple.  Cardiovascular: Normal rate and regular rhythm.   Respiratory: Effort normal and breath sounds normal. No respiratory distress.  GI: He exhibits no distension. There is tenderness in the right lower quadrant. There is rebound, guarding and tenderness at McBurney's point.  Musculoskeletal: Normal range of motion.  Neurological: He is alert and oriented to person, place, and time.  Skin: Skin is warm and dry.  Psychiatric: He has a normal mood and affect. His behavior is normal. Thought content normal.     Assessment/Plan Acute appendicitis  Admission for IV fluids, IV antibiotics and appendectomy  later today by Dr. Marlou Starks. Plan discussed with the patient agrees.  Mirra Basilio A., MD 05/25/2016, 6:23 AM

## 2016-05-25 NOTE — Progress Notes (Signed)
Subjective: Still having pain mid epigastric and RLQ.  No fever.  No prior medical issues.  Objective: Vital signs in last 24 hours: Temp:  [98.4 F (36.9 C)] 98.4 F (36.9 C) (05/09 2128) Pulse Rate:  [55-85] 55 (05/10 0700) Resp:  [18-20] 20 (05/10 0630) BP: (100-148)/(59-98) 118/75 (05/10 0700) SpO2:  [94 %-100 %] 95 % (05/10 0700)    Intake/Output from previous day: No intake/output data recorded. Intake/Output this shift: No intake/output data recorded.  General appearance: alert, cooperative and no distress Resp: clear to auscultation bilaterally GI: tender RLQ, pain mid epigastric site.  Lab Results:   Recent Labs  05/24/16 2138  WBC 9.6  HGB 15.4  HCT 42.9  PLT 192    BMET  Recent Labs  05/24/16 2138  NA 141  K 4.0  CL 109  CO2 21*  GLUCOSE 102*  BUN 13  CREATININE 1.06  CALCIUM 9.7   PT/INR No results for input(s): LABPROT, INR in the last 72 hours.   Recent Labs Lab 05/24/16 2138  AST 37  ALT 33  ALKPHOS 78  BILITOT 0.8  PROT 6.6  ALBUMIN 4.4     Lipase     Component Value Date/Time   LIPASE 23 05/24/2016 2138     Studies/Results: Ct Abdomen Pelvis W Contrast  Result Date: 05/25/2016 CLINICAL DATA:  Upper abdominal pain. EXAM: CT ABDOMEN AND PELVIS WITH CONTRAST TECHNIQUE: Multidetector CT imaging of the abdomen and pelvis was performed using the standard protocol following bolus administration of intravenous contrast. CONTRAST:  ISOVUE-300 IOPAMIDOL (ISOVUE-300) INJECTION 61% COMPARISON:  Right upper quadrant ultrasound 05/25/2016 FINDINGS: Lower chest: Mild dependent changes in the lung bases. Tiny subpleural nodules in the right lung base measuring less than 5 mm, likely small lymph nodes. Hepatobiliary: Mild diffuse fatty infiltration. No focal liver lesions. Gallbladder and bile ducts are unremarkable. Pancreas: Unremarkable. No pancreatic ductal dilatation or surrounding inflammatory changes. Spleen: Normal in size  without focal abnormality. Adrenals/Urinary Tract: Adrenal glands are unremarkable. Kidneys are normal, without renal calculi, focal lesion, or hydronephrosis. Bladder is unremarkable. Stomach/Bowel: The appendix is mildly distended at 11 mm. There is mild infiltration around the appendix. Changes are consistent with early acute appendicitis. No abscess. Stomach, small bowel, and colon are not abnormally distended. No wall thickening is appreciated. Vascular/Lymphatic: No significant vascular findings are present. No enlarged abdominal or pelvic lymph nodes. Reproductive: Prostate gland is enlarged at 4.4 cm diameter. Other: No abdominal wall hernia or abnormality. No abdominopelvic ascites. Musculoskeletal: No acute or significant osseous findings. IMPRESSION: Distended appendix with infiltration in the fat around the appendix consistent with early acute appendicitis. No abscess. Electronically Signed   By: Burman Nieves M.D.   On: 05/25/2016 03:53   US Abdomen Limited  Result Date: 05/25/2016 CLINICAL DATA:  Acute onset of epigastric abdominal pain. Initial encounter. EXAM: US ABDOMEN LIMITED - RIGHT UPPER QUADRANT COMPARISON:  Abdominal ultrasound performed 12/13/2012 FINDINGS: Gallbladder: Contracted and not well characterized. Evaluation is suboptimal due to the patient's habitus. No definite stones are seen. No gallbladder wall thickening or pericholecystic fluid is appreciated. However, a positive ultrasonographic Murphy's sign is elicited. Common bile duct: Diameter: 0.3 cm, within normal limits in caliber. Liver: No focal lesion identified. Diffusely increased parenchymal echogenicity and coarsened echotexture, compatible with fatty infiltration. IMPRESSION: 1. Gallbladder not well assessed due to the patient's habitus. It is contracted, without definite evidence of gallbladder wall thickening or pericholecystic fluid. No definite stones seen. However, a positive ultrasonographic Murphy's sign is  elicited, of uncertain significance. Would correlate with LFTs. 2. Diffuse fatty infiltration within the liver. Electronically Signed   By: Roanna RaiderJeffery  Chang M.D.   On: 05/25/2016 00:41   Prior to Admission medications   Not on File   Anti-infectives    Start     Dose/Rate Route Frequency Ordered Stop   05/25/16 0900  cefTRIAXone (ROCEPHIN) 2 g in dextrose 5 % 50 mL IVPB     2 g 100 mL/hr over 30 Minutes Intravenous Every 24 hours 05/25/16 0840     05/25/16 0900  metroNIDAZOLE (FLAGYL) IVPB 500 mg     500 mg 100 mL/hr over 60 Minutes Intravenous Every 8 hours 05/25/16 0840     05/25/16 0800  cefTRIAXone (ROCEPHIN) 2 g in dextrose 5 % 50 mL IVPB  Status:  Discontinued    Comments:  Pharmacy may adjust dosing strength / duration / interval for maximal efficacy   2 g 100 mL/hr over 30 Minutes Intravenous Every 24 hours 05/25/16 0734 05/25/16 0847   05/25/16 0800  metroNIDAZOLE (FLAGYL) IVPB 500 mg  Status:  Discontinued     500 mg 100 mL/hr over 60 Minutes Intravenous Every 8 hours 05/25/16 0734 05/25/16 0848       Medications: . HYDROmorphone        . cefTRIAXone (ROCEPHIN)  IV     And  . metronidazole 500 mg (05/25/16 0849)  . dextrose 5 % and 0.45 % NaCl with KCl 20 mEq/L    . dextrose 5% lactated ringers 125 mL/hr at 05/25/16 0848    Assessment/Plan acute appendicitis Obesity FEN:  NPO ID:  Rocephin/Flagyl started  05/25/16 pre op DVT:  SCD  He is stable this AM, awaiting surgery       LOS: 0 days    Velia Pamer 05/25/2016 [phone]

## 2016-05-25 NOTE — Op Note (Signed)
05/24/2016 - 05/25/2016  1:32 PM  PATIENT:  Phillip Mcdonald  26 y.o. male  PRE-OPERATIVE DIAGNOSIS:  Acute Appendicitis  POST-OPERATIVE DIAGNOSIS:  Acute Appendicitis  PROCEDURE:  Procedure(s): APPENDECTOMY LAPAROSCOPIC (N/A)  SURGEON:  Surgeon(s) and Role:    Griselda Miner, MD - Primary  PHYSICIAN ASSISTANT:   ASSISTANTS: none   ANESTHESIA:   local and general  EBL:  No intake/output data recorded.  BLOOD ADMINISTERED:none  DRAINS: none   LOCAL MEDICATIONS USED:  MARCAINE     SPECIMEN:  Source of Specimen:  appendix  DISPOSITION OF SPECIMEN:  PATHOLOGY  COUNTS:  YES  TOURNIQUET:  * No tourniquets in log *  DICTATION: .Dragon Dictation   After informed consent was obtained patient was brought to the operating room placed in the supine position on the operating room table. After adequate induction of general anesthesia the patient's abdomen was prepped with ChloraPrep, allowed to dry, and draped in usual sterile manner. An appropriate timeout was performed.  The area below the umbilicus was infiltrated with quarter percent Marcaine. A small incision was made with a 15 blade knife. This incision was carried down through the subcutaneous tissue bluntly with a hemostat and Army-Nav[employer] retractors until the linea alba was identified. The linea alba was incised with a 15 blade knife. Each side was grasped Coker clamps and elevated anteriorly. The preperitoneal space was probed bluntly with a hemostat until the peritoneum was opened and access was gained to the abdominal cavity. A 0 Vicryl purse string stitch was placed in the fascia surrounding the opening. A Hassan cannula was placed through the opening and anchored in place with the previously placed Vicryl purse string stitch. The laparoscope was placed through the [facility] cannula. The abdomen was insufflated with carbon dioxide without difficulty. Next the suprapubic area was infiltrated with quarter percent Marcaine. A small  incision was made with a 15 blade knife. A 5 mm port was placed bluntly through this incision into the abdominal cavity. A site was then chosen between the 2 port for placement of a 5 mm port. The area was infiltrated with quarter percent Marcaine. A small stab incision was made with a 15 blade knife. A 5 mm port was placed bluntly through this incision and the abdominal cavity under direct vision. The laparoscope was then moved to the suprapubic port. Using a Glassman grasper and harmonic scalpel the right lower quadrant was inspected. The appendix was readily identified. The appendix was elevated anteriorly and the mesoappendix was taken down sharply with the harmonic scalpel. Once the base of the appendix where it joined the cecum was identified and cleared of any tissue then a laparoscopic GIA blue load 6 row stapler was placed through the [facility] cannula. The stapler was placed across the base of the appendix clamped and fired thereby dividing the base of the appendix between staple lines. A small area of bleeding from the staple line was controlled with clips. A laparoscopic bag was then inserted through the [facility] cannula. The appendix was placed within the bag and the bag was sealed. The abdomen was then irrigated with copious amounts of saline until the effluent was clear. No other abnormalities were noted. The appendix and bag were removed with the [facility] cannula through the infraumbilical port without difficulty. The fascial defect was closed with the previously placed Vicryl pursestring stitch as well as with another interrupted 0 Vicryl figure-of-eight stitch. The rest of the ports were removed under direct vision and were found to be  hemostatic. The gas was allowed to escape. The skin incisions were closed with interrupted 4-0 Monocryl subcuticular stitches. Dermabond dressings were applied. The patient tolerated the procedure well. At the end of the case all needle sponge and instrument counts were  correct. The patient was then awakened and taken to recovery in stable condition.  PLAN OF CARE: Admit for overnight observation  PATIENT DISPOSITION:  PACU - hemodynamically stable.   Delay start of Pharmacological VTE agent (>24hrs) due to surgical blood loss or risk of bleeding: no

## 2016-05-25 NOTE — Interval H&P Note (Signed)
History and Physical Interval Note:  05/25/2016 11:32 AM  Phillip Mcdonald  has presented today for surgery, with the diagnosis of Acute Appendicitis  The various methods of treatment have been discussed with the patient and family. After consideration of risks, benefits and other options for treatment, the patient has consented to  Procedure(s): APPENDECTOMY LAPAROSCOPIC (N/A) as a surgical intervention .  The patient's history has been reviewed, patient examined, no change in status, stable for surgery.  I have reviewed the patient's chart and labs.  Questions were answered to the patient's satisfaction.     TOTH III,Finley Dinkel S

## 2016-05-25 NOTE — Anesthesia Procedure Notes (Signed)
Procedure Name: Intubation Date/Time: 05/25/2016 12:10 PM Performed by: [name] Pre-anesthesia Checklist: Patient identified, Emergency Drugs available, Suction available and Patient being monitored Patient Re-evaluated:Patient Re-evaluated prior to inductionOxygen Delivery Method: Circle system utilized Preoxygenation: Pre-oxygenation with 100% oxygen Intubation Type: IV induction Ventilation: Mask ventilation without difficulty Laryngoscope Size: Mac and 4 Grade View: Grade I Tube type: Oral Number of attempts: 1 Airway Equipment and Method: Stylet and Oral airway Placement Confirmation: ETT inserted through vocal cords under direct vision,  positive ETCO2 and breath sounds checked- equal and bilateral Secured at: 23 cm Tube secured with: Tape Dental Injury: Teeth and Oropharynx as per pre-operative assessment

## 2016-05-25 NOTE — Progress Notes (Signed)
Patient request d/c peripheral IV. On call MD notified per Amion, response pending.

## 2016-05-25 NOTE — Anesthesia Preprocedure Evaluation (Signed)
Anesthesia Evaluation  Patient identified by MRN, date of birth, ID band Patient awake    Reviewed: Allergy & Precautions, NPO status , Patient's Chart, lab work & pertinent test results  Airway Mallampati: II  TM Distance: >3 FB Neck ROM: Full    Dental   Pulmonary Current Smoker,    Pulmonary exam normal        Cardiovascular Normal cardiovascular exam     Neuro/Psych    GI/Hepatic   Endo/Other    Renal/GU      Musculoskeletal   Abdominal   Peds  Hematology   Anesthesia Other Findings   Reproductive/Obstetrics                             Anesthesia Physical Anesthesia Plan  ASA: III  Anesthesia Plan: General   Post-op Pain Management:    Induction: Intravenous  Airway Management Planned: Oral ETT  Additional Equipment:   Intra-op Plan:   Post-operative Plan: Extubation in OR  Informed Consent: I have reviewed the patients History and Physical, chart, labs and discussed the procedure including the risks, benefits and alternatives for the proposed anesthesia with the patient or authorized representative who has indicated his/her understanding and acceptance.     Plan Discussed with: CRNA and Surgeon  Anesthesia Plan Comments:         Anesthesia Quick Evaluation

## 2016-05-26 ENCOUNTER — Encounter (HOSPITAL_COMMUNITY): Payer: Self-pay | Admitting: General Surgery

## 2016-05-26 MED ORDER — IBUPROFEN 600 MG PO TABS: 600.0000 mg | ORAL_TABLET | Freq: Four times a day (QID) | ORAL | Status: DC | PRN

## 2016-05-26 MED ORDER — ACETAMINOPHEN 325 MG PO TABS: ORAL_TABLET | ORAL | Status: DC

## 2016-05-26 MED ORDER — IBUPROFEN 200 MG PO TABS: ORAL_TABLET | ORAL | Status: DC

## 2016-05-26 MED ORDER — HYDROCODONE-ACETAMINOPHEN 5-325 MG PO TABS: 1.0000 | ORAL_TABLET | ORAL | 0 refills | Status: DC | PRN

## 2016-05-26 MED ORDER — ACETAMINOPHEN 325 MG PO TABS: 650.0000 mg | ORAL_TABLET | Freq: Four times a day (QID) | ORAL | Status: DC | PRN

## 2016-05-26 NOTE — Progress Notes (Signed)
1 Day Post-Op  Chief Complaint/Subjective:  abdominal pain He looks good.   Sites look fine.  He is pretty sore.  Ate last PM, waiting for breakfast.    Objective: Vital signs in last 24 hours: Temp:  [97.5 F (36.4 C)-98.4 F (36.9 C)] 98.2 F (36.8 C) (05/11 0553) Pulse Rate:  [58-83] 73 (05/11 0553) Resp:  [11-19] 17 (05/11 0553) BP: (103-140)/(49-86) 105/49 (05/11 0553) SpO2:  [95 %-97 %] 97 % (05/11 0553) Weight:  [121 kg (266 lb 12.1 oz)] 121 kg (266 lb 12.1 oz) (05/10 1155) Last BM Date: 05/25/16 500 PO 1087 IV 300 urine recorded Afebrile,VSS Wbc is normal   Intake/Output from previous day: 05/10 0701 - 05/11 0700 In: 1587.5 [P.O.:500; I.V.:1087.5] Out: 300 [Urine:300] Intake/Output this shift: No intake/output data recorded.  General appearance: alert, cooperative and no distress Resp: clear to auscultation bilaterally GI: soft, sore, sites look great.  Lab Results:   Recent Labs  05/24/16 2138 05/25/16 0920  WBC 9.6 6.4  HGB 15.4 15.0  HCT 42.9 43.2  PLT 192 177    BMET  Recent Labs  05/24/16 2138 05/25/16 0920  NA 141  --   K 4.0  --   CL 109  --   CO2 21*  --   GLUCOSE 102*  --   BUN 13  --   CREATININE 1.06 0.96  CALCIUM 9.7  --    PT/INR No results for input(s): LABPROT, INR in the last 72 hours.   Recent Labs Lab 05/24/16 2138  AST 37  ALT 33  ALKPHOS 78  BILITOT 0.8  PROT 6.6  ALBUMIN 4.4     Lipase     Component Value Date/Time   LIPASE 23 05/24/2016 2138     Medications: . heparin  5,000 Units Subcutaneous Q8H  . pantoprazole (PROTONIX) IV  40 mg Intravenous QHS    Assessment/Plan acute appendicitis s/p laparoscopic appendectomy 05/25/16, Dr. Chevis PrettyPaul Toth III Obesity FEN:  NPO ID:  Rocephin/Flagyl started  05/25/16 pre op DVT:  SCD     Plan:  Home later today. 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  He reports he will need the 3 weeks off before returning to work.     I  have personally reviewed the patients medication history on the [place] controlled substance database.  LOS: 0 days    Jersee Winiarski 05/26/2016 [id number]

## 2016-05-26 NOTE — Anesthesia Postprocedure Evaluation (Signed)
Anesthesia Post Note  Patient: Phillip MulletCharles Mcdonald  Procedure(s) Performed: Procedure(s) (LRB): APPENDECTOMY LAPAROSCOPIC (N/A)  Patient location during evaluation: PACU Anesthesia Type: General Level of consciousness: awake and alert Pain management: pain level controlled Vital Signs Assessment: post-procedure vital signs reviewed and stable Respiratory status: spontaneous breathing, nonlabored ventilation, respiratory function stable and patient connected to nasal cannula oxygen Cardiovascular status: blood pressure returned to baseline and stable Postop Assessment: no signs of nausea or vomiting Anesthetic complications: no       Last Vitals:  Vitals:   05/25/16 2106 05/26/16 0553  BP: 126/79 (!) 105/49  Pulse: 73 73  Resp: 17 17  Temp: 36.6 C 36.8 C    Last Pain:  Vitals:   05/26/16 0553  TempSrc: Oral  PainSc:                  [name] DAVID

## 2016-05-26 NOTE — Progress Notes (Addendum)
Patient ambulating unit hall X 2 laps, tolerated well. Accompanied by family.

## 2016-06-22 ENCOUNTER — Emergency Department (HOSPITAL_COMMUNITY)
Admission: EM | Admit: 2016-06-22 | Discharge: 2016-06-22 | Disposition: A | Discharge status: Home / Self Care | Payer: Self-pay | Attending: Emergency Medicine | Admitting: Emergency Medicine

## 2016-06-22 ENCOUNTER — Encounter (HOSPITAL_COMMUNITY): Payer: Self-pay | Admitting: Emergency Medicine

## 2016-06-22 ENCOUNTER — Emergency Department (HOSPITAL_COMMUNITY): Payer: Self-pay

## 2016-06-22 DIAGNOSIS — X501XXA Overexertion from prolonged static or awkward postures, initial encounter: Secondary | ICD-10-CM | POA: Insufficient documentation

## 2016-06-22 DIAGNOSIS — Y929 Unspecified place or not applicable: Secondary | ICD-10-CM | POA: Insufficient documentation

## 2016-06-22 DIAGNOSIS — S93602A Unspecified sprain of left foot, initial encounter: Secondary | ICD-10-CM | POA: Insufficient documentation

## 2016-06-22 DIAGNOSIS — Y999 Unspecified external cause status: Secondary | ICD-10-CM | POA: Insufficient documentation

## 2016-06-22 DIAGNOSIS — Y939 Activity, unspecified: Secondary | ICD-10-CM | POA: Insufficient documentation

## 2016-06-22 DIAGNOSIS — F17210 Nicotine dependence, cigarettes, uncomplicated: Secondary | ICD-10-CM | POA: Insufficient documentation

## 2016-06-22 MED ORDER — HYDROCODONE-ACETAMINOPHEN 5-325 MG PO TABS: 2.0000 | ORAL_TABLET | ORAL | 0 refills | Status: DC | PRN

## 2016-06-22 MED ORDER — IBUPROFEN 800 MG PO TABS: 800.0000 mg | ORAL_TABLET | Freq: Three times a day (TID) | ORAL | 0 refills | Status: DC

## 2016-06-22 NOTE — ED Notes (Signed)
See EDP secondary assessment.  

## 2016-06-22 NOTE — ED Triage Notes (Signed)
Pt reports he stepped on one of his kids toys 2 days ago and heard a pop in his left ankle. Pt has pain and swelling present, is able to bear weight.

## 2016-06-22 NOTE — ED Notes (Signed)
Patient transported to X-ray 

## 2016-06-22 NOTE — ED Provider Notes (Signed)
[facility]-EMERGENCY DEPT Provider Note    By signing my name below, I, Phillip Mcdonald, attest that this documentation has been prepared under the direction and in the presence of Phillip Mcdonald, [place]. Electronically Signed: Earmon PhoenixJennifer Mcdonald, ED Scribe. 06/22/16. 10:31 AM.    History   Chief Complaint Chief Complaint  Patient presents with  . Ankle Pain   The history is provided by the patient and medical records. No language interpreter was used.    Phillip Mcdonald is an obese 26 y.o. male who presents to the Emergency Department complaining of left ankle pain that began two days ago. He reports associated swelling and bruising of the ankle. He states he stepped on one of his children's toys and heard a popping sensation in the ankle. He has not taken anything for pain. Walking and bearing weight increases the pain. He denies alleviating factors. He denies numbness, tingling or weakness of the left foot or leg.   Past Medical History:  Diagnosis Date  . Appendicitis 05/2016  . Obesity     Patient Active Problem List   Diagnosis Date Noted  . Acute appendicitis 05/25/2016  . Appendicitis 05/25/2016    Past Surgical History:  Procedure Laterality Date  . LAPAROSCOPIC APPENDECTOMY N/A 05/25/2016   Procedure: APPENDECTOMY LAPAROSCOPIC;  Surgeon: Griselda Mineroth, Paul III, MD;  [facility];  Service: General;  Laterality: N/A;  . WISDOM TOOTH EXTRACTION         Home Medications    Prior to Admission medications   Medication Sig Start Date End Date Taking? Authorizing Provider  acetaminophen (TYLENOL) 325 MG tablet DO NOT TAKE MORE THAN 4000 MG OF TYLENOL PER DAY.  IT CAN HARM YOUR LIVER.  TYLENOL (ACETAMINOPHEN) IS ALSO IN YOUR PRESCRIPTION PAIN MEDICATION.  YOU HAVE TO COUNT IT IN YOUR DAILY TOTAL. 05/26/16   Sherrie GeorgeJennings, Willard, PA-C  HYDROcodone-acetaminophen (NORCO/VICODIN) 5-325 MG tablet Take 1-2 tablets by mouth every 4 (four) hours as needed for moderate pain. 05/26/16   Sherrie GeorgeJennings,  Willard, PA-C  ibuprofen (ADVIL,MOTRIN) 200 MG tablet You can take 2-3 tablets every 6 hours for pain as needed.  I would take this first. You can alternate this with plain Tylenol or the prescribed pain medication. 05/26/16   Sherrie GeorgeJennings, Willard, PA-C    Family History No family history on file.  Social History Social History  Substance Use Topics  . Smoking status: Current Every Day Smoker    Packs/day: 0.50    Years: 4.00    Types: Cigarettes  . Smokeless tobacco: Never Used  . Alcohol use No     Allergies   Shrimp [shellfish allergy] and Nutrasweet aspartame [aspartame]   Review of Systems Review of Systems  Musculoskeletal: Positive for arthralgias and joint swelling.  Skin: Positive for color change. Negative for wound.  Neurological: Negative for weakness and numbness.     Physical Exam Updated Vital Signs BP (!) 137/94 (BP Location: Right Arm)   Temp 98.9 F (37.2 C) (Oral)   Resp 18   SpO2 99%   Physical Exam  Constitutional: He is oriented to person, place, and time. He appears well-developed and well-nourished.  HENT:  Head: Normocephalic and atraumatic.  Neck: Normal range of motion.  Cardiovascular: Normal rate.   Pulmonary/Chest: Effort normal.  Musculoskeletal: He exhibits edema and tenderness. He exhibits no deformity.  Ecchymosis and edema to lower left foot and ankle. TTP. Pain with ROM. Achilles intact.  Neurological: He is alert and oriented to person, place, and time.  NVI and  neurosensory intact.  Skin: Skin is warm and dry.  Psychiatric: He has a normal mood and affect. His behavior is normal.  Nursing note and vitals reviewed.    ED Treatments / Results  DIAGNOSTIC STUDIES: Oxygen Saturation is 99% on RA, normal by my interpretation.   COORDINATION OF CARE: 10:25 AM- Recommended RICE therapy and referral to orthopedist. Pt verbalizes understanding and agrees to plan.  Medications - No data to display   Labs (all labs ordered are  listed, but only abnormal results are displayed) Labs Reviewed - No data to display  EKG  EKG Interpretation None       Radiology Dg Ankle Complete Left  Result Date: 06/22/2016 CLINICAL DATA:  Left ankle pain bruising and swelling predominantly laterally after rolling injury 2 days ago. EXAM: LEFT ANKLE COMPLETE - 3+ VIEW COMPARISON:  Left foot series of July 20, 2009 FINDINGS: The bones are subjectively adequately mineralized. The ankle joint mortise is preserved. The talar dome is intact. There is no acute malleolar fracture. A tiny spur arises from the inferior articular margin of the medial malleolus. The talus and calcaneus are intact. There is soft tissue swelling diffusely. IMPRESSION: No acute fracture or dislocation of the left ankle is observed. Electronically Signed   By: David  [place] M.D.   On: 06/22/2016 09:57    Procedures Procedures (including critical care time)  Medications Ordered in ED Medications - No data to display   Initial Impression / Assessment and Plan / ED Course  I have reviewed the triage vital signs and the nursing notes.  Pertinent labs & imaging results that were available during my care of the patient were reviewed by me and considered in my medical decision making (see chart for details).     Patient presenting today after an ankle injury two days ago. X-Ray negative for obvious fracture or dislocation. Pt advised to follow up with orthopedics. Patient given brace while in ED, conservative therapy recommended and discussed. Patient will be discharged home & is agreeable with above plan. Returns precautions discussed. Pt appears safe for discharge.   Final Clinical Impressions(s) / ED Diagnoses   Final diagnoses:  Foot sprain, left, initial encounter    New Prescriptions Discharge Medication List as of 06/22/2016 10:38 AM      An After Visit Summary was printed and given to the patient.  I personally performed the services in this  documentation, which was scribed in my presence.  The recorded information has been reviewed and considered.   Barnet Pall.    Elson Areas, PA-C 06/22/16 1622    Tilden Fossa, MD 06/23/16 [phone]

## 2016-06-22 NOTE — Discharge Instructions (Signed)
Return if any problems. See the Orthopaedist for recheck in 1 week if pain persist 

## 2016-12-11 ENCOUNTER — Other Ambulatory Visit: Payer: Self-pay

## 2016-12-11 ENCOUNTER — Encounter (HOSPITAL_COMMUNITY): Payer: Self-pay

## 2016-12-11 ENCOUNTER — Emergency Department (HOSPITAL_COMMUNITY)
Admission: EM | Admit: 2016-12-11 | Discharge: 2016-12-12 | Disposition: A | Discharge status: Home / Self Care | Payer: 59 | Attending: Emergency Medicine | Admitting: Emergency Medicine

## 2016-12-11 DIAGNOSIS — Z791 Long term (current) use of non-steroidal anti-inflammatories (NSAID): Secondary | ICD-10-CM | POA: Insufficient documentation

## 2016-12-11 DIAGNOSIS — F17210 Nicotine dependence, cigarettes, uncomplicated: Secondary | ICD-10-CM | POA: Diagnosis not present

## 2016-12-11 DIAGNOSIS — Z79899 Other long term (current) drug therapy: Secondary | ICD-10-CM | POA: Insufficient documentation

## 2016-12-11 DIAGNOSIS — M5442 Lumbago with sciatica, left side: Secondary | ICD-10-CM | POA: Diagnosis not present

## 2016-12-11 DIAGNOSIS — M5441 Lumbago with sciatica, right side: Secondary | ICD-10-CM | POA: Diagnosis not present

## 2016-12-11 DIAGNOSIS — G8929 Other chronic pain: Secondary | ICD-10-CM

## 2016-12-11 DIAGNOSIS — M545 Low back pain: Secondary | ICD-10-CM | POA: Diagnosis present

## 2016-12-11 NOTE — ED Triage Notes (Signed)
Pt states that for past several months he has been having lower back pain, was seen at a chiropractor and since pain has got worse, denies urinary symptoms

## 2016-12-12 MED ORDER — METHYLPREDNISOLONE 4 MG PO TBPK: ORAL_TABLET | ORAL | 0 refills | Status: DC

## 2016-12-12 MED ORDER — TRAMADOL HCL 50 MG PO TABS: 50.0000 mg | ORAL_TABLET | Freq: Four times a day (QID) | ORAL | 0 refills | Status: DC | PRN

## 2016-12-12 NOTE — ED Notes (Signed)
PT states understanding of care given, follow up care, and medication prescribed. PT ambulated from ED to car with a steady gait. 
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
## 2016-12-12 NOTE — ED Notes (Signed)
Patient is A&Ox4.  No signs of distress noted.  Please see providers complete history and physical exam.  

## 2016-12-12 NOTE — Discharge Instructions (Signed)
Please read the attached information regarding your condition. Take medications as directed. Follow-up with orthopedist listed below for further evaluation. Return to ED for injuries, falls, worsening back pain, numbness in legs, loss of bladder function or burning with urination.

## 2016-12-25 ENCOUNTER — Ambulatory Visit (INDEPENDENT_AMBULATORY_CARE_PROVIDER_SITE_OTHER): Payer: 59 | Admitting: Orthopedic Surgery

## 2017-01-01 ENCOUNTER — Ambulatory Visit (INDEPENDENT_AMBULATORY_CARE_PROVIDER_SITE_OTHER): Payer: 59

## 2017-01-01 ENCOUNTER — Ambulatory Visit (INDEPENDENT_AMBULATORY_CARE_PROVIDER_SITE_OTHER): Payer: 59 | Admitting: Family

## 2017-01-01 ENCOUNTER — Encounter (INDEPENDENT_AMBULATORY_CARE_PROVIDER_SITE_OTHER): Payer: Self-pay | Admitting: Family

## 2017-01-01 DIAGNOSIS — G8929 Other chronic pain: Secondary | ICD-10-CM

## 2017-01-01 DIAGNOSIS — M546 Pain in thoracic spine: Secondary | ICD-10-CM

## 2017-01-01 DIAGNOSIS — M542 Cervicalgia: Secondary | ICD-10-CM

## 2017-01-01 DIAGNOSIS — M545 Low back pain: Secondary | ICD-10-CM | POA: Diagnosis not present

## 2017-01-01 MED ORDER — DICLOFENAC SODIUM 75 MG PO TBEC: 75.0000 mg | DELAYED_RELEASE_TABLET | Freq: Two times a day (BID) | ORAL | 3 refills | Status: AC | PRN
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
## 2017-01-03 ENCOUNTER — Encounter (INDEPENDENT_AMBULATORY_CARE_PROVIDER_SITE_OTHER): Payer: Self-pay | Admitting: Family

## 2017-02-01 ENCOUNTER — Ambulatory Visit (INDEPENDENT_AMBULATORY_CARE_PROVIDER_SITE_OTHER): Payer: 59 | Admitting: Orthopedic Surgery

## 2017-02-28 ENCOUNTER — Encounter (HOSPITAL_COMMUNITY): Payer: Self-pay

## 2017-02-28 ENCOUNTER — Other Ambulatory Visit: Payer: Self-pay

## 2017-02-28 ENCOUNTER — Emergency Department (HOSPITAL_COMMUNITY)
Admission: EM | Admit: 2017-02-28 | Discharge: 2017-02-28 | Disposition: A | Discharge status: Home / Self Care | Payer: 59 | Attending: Emergency Medicine | Admitting: Emergency Medicine

## 2017-02-28 DIAGNOSIS — F17210 Nicotine dependence, cigarettes, uncomplicated: Secondary | ICD-10-CM | POA: Diagnosis not present

## 2017-02-28 DIAGNOSIS — K2900 Acute gastritis without bleeding: Secondary | ICD-10-CM

## 2017-02-28 DIAGNOSIS — R1013 Epigastric pain: Secondary | ICD-10-CM | POA: Diagnosis present

## 2017-02-28 LAB — CBC WITH DIFFERENTIAL/PLATELET
BASOS ABS: 0 10*3/uL (ref 0.0–0.1)
Basophils Relative: 0 %
Eosinophils Absolute: 0.2 10*3/uL (ref 0.0–0.7)
Eosinophils Relative: 2 %
HEMATOCRIT: 50.7 % (ref 39.0–52.0)
HEMOGLOBIN: 17.7 g/dL — AB (ref 13.0–17.0)
Lymphocytes Relative: 22 %
Lymphs Abs: 2.7 10*3/uL (ref 0.7–4.0)
MCH: 30.1 pg (ref 26.0–34.0)
MCHC: 34.9 g/dL (ref 30.0–36.0)
MCV: 86.2 fL (ref 78.0–100.0)
Monocytes Absolute: 0.8 10*3/uL (ref 0.1–1.0)
Monocytes Relative: 6 %
NEUTROS ABS: 8.7 10*3/uL — AB (ref 1.7–7.7)
Neutrophils Relative %: 70 %
Platelets: 189 10*3/uL (ref 150–400)
RBC: 5.88 MIL/uL — AB (ref 4.22–5.81)
RDW: 12.1 % (ref 11.5–15.5)
WBC: 12.4 10*3/uL — AB (ref 4.0–10.5)

## 2017-02-28 LAB — COMPREHENSIVE METABOLIC PANEL
ALT: 32 U/L (ref 17–63)
AST: 23 U/L (ref 15–41)
Albumin: 4.4 g/dL (ref 3.5–5.0)
Alkaline Phosphatase: 75 U/L (ref 38–126)
Anion gap: 12 (ref 5–15)
BILIRUBIN TOTAL: 1 mg/dL (ref 0.3–1.2)
BUN: 15 mg/dL (ref 6–20)
CO2: 23 mmol/L (ref 22–32)
CREATININE: 1.04 mg/dL (ref 0.61–1.24)
Calcium: 9.3 mg/dL (ref 8.9–10.3)
Chloride: 105 mmol/L (ref 101–111)
GFR calc Af Amer: >60 mL/min (ref >60)
Glucose, Bld: 88 mg/dL (ref 65–99)
Potassium: 4 mmol/L (ref 3.5–5.1)
Sodium: 140 mmol/L (ref 135–145)
TOTAL PROTEIN: 7.3 g/dL (ref 6.5–8.1)

## 2017-02-28 LAB — LIPASE, BLOOD: LIPASE: 27 U/L (ref 11–51)

## 2017-02-28 MED ORDER — FAMOTIDINE IN NACL 20-0.9 MG/50ML-% IV SOLN
20.0000 mg | Freq: Once | INTRAVENOUS | Status: AC
  Administered 2017-02-28: 20 mg via INTRAVENOUS
  Filled 2017-02-28: qty 50

## 2017-02-28 MED ORDER — ONDANSETRON HCL 4 MG/2ML IJ SOLN
4.0000 mg | Freq: Once | INTRAMUSCULAR | Status: AC
  Administered 2017-02-28: 4 mg via INTRAVENOUS
  Filled 2017-02-28: qty 2

## 2017-02-28 MED ORDER — SUCRALFATE 1 GM/10ML PO SUSP: 1.0000 g | Freq: Three times a day (TID) | ORAL | 0 refills | Status: DC

## 2017-02-28 MED ORDER — SODIUM CHLORIDE 0.9 % IV BOLUS (SEPSIS)
2000.0000 mL | Freq: Once | INTRAVENOUS | Status: AC
  Administered 2017-02-28: 2000 mL via INTRAVENOUS

## 2017-02-28 MED ORDER — PROMETHAZINE HCL 25 MG/ML IJ SOLN
25.0000 mg | Freq: Once | INTRAMUSCULAR | Status: DC
  Filled 2017-02-28 (×2): qty 1

## 2017-02-28 MED ORDER — ONDANSETRON HCL 4 MG/2ML IJ SOLN
4.0000 mg | Freq: Once | INTRAMUSCULAR | Status: AC
  Administered 2017-02-28: 4 mg via INTRAMUSCULAR
  Filled 2017-02-28: qty 2

## 2017-02-28 MED ORDER — PROMETHAZINE HCL 25 MG PO TABS: 25.0000 mg | ORAL_TABLET | Freq: Four times a day (QID) | ORAL | 0 refills | Status: DC | PRN

## 2017-02-28 MED ORDER — GI COCKTAIL ~~LOC~~
30.0000 mL | Freq: Once | ORAL | Status: AC
  Administered 2017-02-28: 30 mL via ORAL
  Filled 2017-02-28: qty 30

## 2017-02-28 NOTE — ED Provider Notes (Signed)
[facility] Provider Note   CSN: [id number] Arrival date & time: 02/28/17  1245     History   Chief Complaint Chief Complaint  Patient presents with  . Abdominal Pain   HPI  Blood pressure (!) 142/65, pulse 92, temperature 98.2 F (36.8 C), temperature source Oral, resp. rate 16, weight 127 kg (280 lb), SpO2 100 %.  Phillip MulletCharles Mcdonald is a 27 y.o. male complaining of epigastric abdominal pain radiating to the back with associated nonbloody, nonbilious, non-coffee-ground emesis with normal stool onset this morning.  He denies sick contacts, fever chills, change in urination.  He states that the pain is severe no exacerbating or alleviating factors identified.  He states he also has developed a mild global headache over the course of the day.  He states that this feels similar to when he had appendicitis several months ago.  Past Medical History:  Diagnosis Date  . Appendicitis 05/2016  . Obesity     Patient Active Problem List   Diagnosis Date Noted  . Acute appendicitis 05/25/2016    Past Surgical History:  Procedure Laterality Date  . APPENDECTOMY    . LAPAROSCOPIC APPENDECTOMY N/A 05/25/2016   Procedure: APPENDECTOMY LAPAROSCOPIC;  Surgeon: Griselda Mineroth, Paul III, MD;  [facility];  Service: General;  Laterality: N/A;  . WISDOM TOOTH EXTRACTION         Home Medications    Prior to Admission medications   Medication Sig Start Date End Date Taking? Authorizing Provider  ibuprofen (ADVIL,MOTRIN) 800 MG tablet Take 1 tablet (800 mg total) by mouth 3 (three) times daily. Patient not taking: Reported on 02/28/2017 06/22/16   Elson AreasSofia, Leslie K, PA-C  methylPREDNISolone (MEDROL DOSEPAK) 4 MG TBPK tablet Taper over 6 days. Patient not taking: Reported on 02/28/2017 12/12/16   Dietrich PatesKhatri, Hina, PA-C  promethazine (PHENERGAN) 25 MG tablet Take 1 tablet (25 mg total) by mouth every 6 (six) hours as needed for nausea or vomiting. 02/28/17   Lazarius Rivkin,  Joni ReiningNicole, PA-C  sucralfate (CARAFATE) 1 GM/10ML suspension Take 10 mLs (1 g total) by mouth 4 (four) times daily -  with meals and at bedtime. 02/28/17   Jaskiran Pata, Joni ReiningNicole, PA-C  traMADol (ULTRAM) 50 MG tablet Take 1 tablet (50 mg total) by mouth every 6 (six) hours as needed. Patient not taking: Reported on 02/28/2017 12/12/16   Dietrich PatesKhatri, Hina, PA-C    Family History No family history on file.  Social History Social History   Tobacco Use  . Smoking status: Current Every Day Smoker    Packs/day: 0.50    Years: 4.00    Pack years: 2.00    Types: Cigarettes  . Smokeless tobacco: Never Used  Substance Use Topics  . Alcohol use: No  . Drug use: No     Allergies   Morphine and related; Shrimp [shellfish allergy]; and Nutrasweet aspartame [aspartame]   Review of Systems Review of Systems  A complete review of systems was obtained and all systems are negative except as noted in the HPI and PMH.   Physical Exam Updated Vital Signs BP 140/86   Pulse 91   Temp 98.2 F (36.8 C) (Oral)   Resp 16   Wt 127 kg (280 lb)   SpO2 97%   BMI 39.05 kg/m   Physical Exam  Constitutional: He is oriented to person, place, and time. He appears well-developed and well-nourished. No distress.  HENT:  Head: Normocephalic and atraumatic.  Mouth/Throat: Oropharynx is clear and moist.  Eyes:  Conjunctivae and EOM are normal. Pupils are equal, round, and reactive to light.  Neck: Normal range of motion.  Cardiovascular: Normal rate, regular rhythm and intact distal pulses.  Pulmonary/Chest: Effort normal and breath sounds normal.  Abdominal: Soft. There is tenderness.  Mild epigastric tenderness with no guarding or rebound, Rovsing, psoas, obturator negative, Murphy sign negative  Musculoskeletal: Normal range of motion.  Neurological: He is alert and oriented to person, place, and time.  Skin: He is not diaphoretic.  Psychiatric: He has a normal mood and affect.  Nursing note and vitals  reviewed.    ED Treatments / Results  Labs (all labs ordered are listed, but only abnormal results are displayed) Labs Reviewed  CBC WITH DIFFERENTIAL/PLATELET - Abnormal; Notable for the following components:      Result Value   WBC 12.4 (*)    RBC 5.88 (*)    Hemoglobin 17.7 (*)    Neutro Abs 8.7 (*)    All other components within normal limits  COMPREHENSIVE METABOLIC PANEL  LIPASE, BLOOD    EKG  EKG Interpretation None       Radiology No results found.  Procedures Procedures (including critical care time)  Medications Ordered in ED Medications  ondansetron (ZOFRAN) injection 4 mg (4 mg Intramuscular Given 02/28/17 1350)  sodium chloride 0.9 % bolus 2,000 mL (0 mLs Intravenous Stopped 02/28/17 1920)  ondansetron (ZOFRAN) injection 4 mg (4 mg Intravenous Given 02/28/17 1733)  famotidine (PEPCID) IVPB 20 mg premix (0 mg Intravenous Stopped 02/28/17 1816)  gi cocktail (Maalox,Lidocaine,Donnatal) (30 mLs Oral Given 02/28/17 1920)     Initial Impression / Assessment and Plan / ED Course  I have reviewed the triage vital signs and the nursing notes.  Pertinent labs & imaging results that were available during my care of the patient were reviewed by me and considered in my medical decision making (see chart for details).     Vitals:   02/28/17 1615 02/28/17 1630 02/28/17 1800 02/28/17 2030  BP: (!) 142/65 130/88  140/86  Pulse: 92 82 85 91  Resp: 16     Temp:      TempSrc:      SpO2: 100% 98% 99% 97%  Weight:        Medications  ondansetron (ZOFRAN) injection 4 mg (4 mg Intramuscular Given 02/28/17 1350)  sodium chloride 0.9 % bolus 2,000 mL (0 mLs Intravenous Stopped 02/28/17 1920)  ondansetron (ZOFRAN) injection 4 mg (4 mg Intravenous Given 02/28/17 1733)  famotidine (PEPCID) IVPB 20 mg premix (0 mg Intravenous Stopped 02/28/17 1816)  gi cocktail (Maalox,Lidocaine,Donnatal) (30 mLs Oral Given 02/28/17 1920)    Phillip Mcdonald is 27 y.o. male presenting with  epigastric pain radiating to the back multiple episodes of nausea and vomiting, no real diarrhea.  No tenderness over the right upper quadrant.  Blood work reassuring mild leukocytosis and hemoconcentration.  Patient is aggressively hydrated, he still has some epigastric discomfort after Pepcid.  Will give GI cocktail.  Repeat abdominal exam is benign, he is tolerating p.o.'s.  Work note provided and patient counseled on slow reinitiation of normal diet.   Triage initiated EKG negative, I doubt this is his heart.  Evaluation does not show pathology that would require ongoing emergent intervention or inpatient treatment. Pt is hemodynamically stable and mentating appropriately. Discussed findings and plan with patient/guardian, who agrees with care plan. All questions answered. Return precautions discussed and outpatient follow up given.      Final Clinical Impressions(s) / ED Diagnoses  Final diagnoses:  Acute gastritis without hemorrhage, unspecified gastritis type    ED Discharge Orders        Ordered    promethazine (PHENERGAN) 25 MG tablet  Every 6 hours PRN     02/28/17 2019    sucralfate (CARAFATE) 1 GM/10ML suspension  3 times daily with meals & bedtime     02/28/17 2019       Phillip Mcdonald, Mardella Layman 02/28/17 2039    Linwood Dibbles, MD 02/28/17 2039

## 2017-02-28 NOTE — ED Provider Notes (Signed)
  Patient placed in Quick Look pathway, seen and evaluated for chief complaint of nausea, vomiting, diarrhea.  Pertinent H&P findings include Speech is clear and coherent, non-toxic appearing, no respiratory distress.  Vomiting. .  Based on initial evaluation, labs are indicated and radiology studies are not indicated.  Patient counseled on process, plan, and necessity for staying for completing the evaluation.    Everlene FarrierDansie, Idabelle Mcpeters, PA-C 02/28/17 1349    Cathren LaineSteinl, Kevin, MD 02/28/17 1438

## 2017-02-28 NOTE — ED Triage Notes (Signed)
Pt presents to the ed with complaints of abdominal pain and nausea x 1 day, emesis x 4.  States pain radiates to his back.

## 2017-02-28 NOTE — Discharge Instructions (Signed)

## 2017-04-23 ENCOUNTER — Other Ambulatory Visit: Payer: Self-pay

## 2017-04-23 ENCOUNTER — Emergency Department (HOSPITAL_COMMUNITY)
Admission: EM | Admit: 2017-04-23 | Discharge: 2017-04-23 | Disposition: A | Discharge status: Home / Self Care | Payer: 59 | Attending: Emergency Medicine | Admitting: Emergency Medicine

## 2017-04-23 ENCOUNTER — Encounter (HOSPITAL_COMMUNITY): Payer: Self-pay | Admitting: Neurology

## 2017-04-23 ENCOUNTER — Emergency Department (HOSPITAL_COMMUNITY): Payer: 59

## 2017-04-23 DIAGNOSIS — F17210 Nicotine dependence, cigarettes, uncomplicated: Secondary | ICD-10-CM | POA: Diagnosis not present

## 2017-04-23 DIAGNOSIS — R05 Cough: Secondary | ICD-10-CM | POA: Diagnosis present

## 2017-04-23 DIAGNOSIS — J069 Acute upper respiratory infection, unspecified: Secondary | ICD-10-CM | POA: Insufficient documentation

## 2017-04-23 DIAGNOSIS — Z79899 Other long term (current) drug therapy: Secondary | ICD-10-CM | POA: Insufficient documentation

## 2017-04-23 MED ORDER — BENZONATATE 100 MG PO CAPS: 100.0000 mg | ORAL_CAPSULE | Freq: Three times a day (TID) | ORAL | 0 refills | Status: DC

## 2017-04-23 NOTE — Discharge Instructions (Addendum)
Please read attached information. If you experience any new or worsening signs or symptoms please return to the emergency room for evaluation. Please follow-up with your primary care provider or specialist as discussed. Please use medication prescribed only as directed and discontinue taking if you have any concerning signs or symptoms.   °

## 2017-04-23 NOTE — ED Triage Notes (Addendum)
Pt reports for past 2.5 weeks has had chest congestion, when he lays down, feels like he is drowning b/c of fluid accumulation. Was rx zpack today at urgent care, told to get chest xray within 24 hours. He came here to get CXR. At times cough is productive, is green sputum. C/o central chest pressure, sitting on his chest.

## 2017-04-23 NOTE — ED Provider Notes (Signed)
[facility]   CSN: [id number] Arrival date & time: 04/23/17  0950     History   Chief Complaint Chief Complaint  Patient presents with  . Cough    HPI Phillip Mcdonald is a 27 y.o. male.   HPI     27 year old male presents today with complaints of cough.  [facility] to [facility] emergency room for chest x-ray.  He notes 2 weeks of coughing, shortness of breath when sleeping at night and phlegm.  Patient denies any rhinorrhea or congestion.  Patient denies any chest pain.  He denies any fever.  Patient denies any cardiac or pulmonary history including smoking, asthma.  No medications prior to arrival.  Patient was prescribed azithromycin prior to evaluation.  Patient does know he has some tenderness along his chest with palpation.  No history DVT or PE, no significant risk factors.     Past Medical History:  Diagnosis Date  . Appendicitis 05/2016  . Obesity     Patient Active Problem List   Diagnosis Date Noted  . Acute appendicitis 05/25/2016    Past Surgical History:  Procedure Laterality Date  . APPENDECTOMY    . LAPAROSCOPIC APPENDECTOMY N/A 05/25/2016   Procedure: APPENDECTOMY LAPAROSCOPIC;  Surgeon: Griselda Mineroth, Paul III, MD;  [facility];  Service: General;  Laterality: N/A;  . WISDOM TOOTH EXTRACTION          Home Medications    Prior to Admission medications   Medication Sig Start Date End Date Taking? Authorizing Provider  benzonatate (TESSALON) 100 MG capsule Take 1 capsule (100 mg total) by mouth every 8 (eight) hours. 04/23/17   Gedalia Mcmillon, Tinnie GensJeffrey, PA-C  ibuprofen (ADVIL,MOTRIN) 800 MG tablet Take 1 tablet (800 mg total) by mouth 3 (three) times daily. Patient not taking: Reported on 02/28/2017 06/22/16   Elson AreasSofia, Leslie K, PA-C  methylPREDNISolone (MEDROL DOSEPAK) 4 MG TBPK tablet Taper over 6 days. Patient not taking: Reported on 02/28/2017 12/12/16   Dietrich PatesKhatri, Hina,  PA-C  promethazine (PHENERGAN) 25 MG tablet Take 1 tablet (25 mg total) by mouth every 6 (six) hours as needed for nausea or vomiting. 02/28/17   Pisciotta, Joni ReiningNicole, PA-C  sucralfate (CARAFATE) 1 GM/10ML suspension Take 10 mLs (1 g total) by mouth 4 (four) times daily -  with meals and at bedtime. 02/28/17   Pisciotta, Joni ReiningNicole, PA-C  traMADol (ULTRAM) 50 MG tablet Take 1 tablet (50 mg total) by mouth every 6 (six) hours as needed. Patient not taking: Reported on 02/28/2017 12/12/16   Dietrich PatesKhatri, Hina, PA-C    Family History No family history on file.  Social History Social History   Tobacco Use  . Smoking status: Current Every Day Smoker    Packs/day: 0.50    Years: 4.00    Pack years: 2.00    Types: Cigarettes  . Smokeless tobacco: Never Used  Substance Use Topics  . Alcohol use: No  . Drug use: No     Allergies   Morphine and related; Shrimp [shellfish allergy]; and Nutrasweet aspartame [aspartame]   Review of Systems Review of Systems  All other systems reviewed and are negative.    Physical Exam Updated Vital Signs BP (!) 151/90 (BP Location: Right Arm)   Pulse 73   Temp 98.4 F (36.9 C) (Oral)   Resp 18   Ht 6' (1.829 m)   Wt 124.7 kg (275 lb)   SpO2 98%   BMI  37.30 kg/m   Physical Exam  Constitutional: He is oriented to person, place, and time. He appears well-developed and well-nourished.  HENT:  Head: Normocephalic and atraumatic.  Eyes: Pupils are equal, round, and reactive to light. Conjunctivae are normal. Right eye exhibits no discharge. Left eye exhibits no discharge. No scleral icterus.  Neck: Normal range of motion. No JVD present. No tracheal deviation present.  Cardiovascular: Normal rate, regular rhythm, normal heart sounds and intact distal pulses. Exam reveals no gallop and no friction rub.  No murmur heard. Pulmonary/Chest: Effort normal and breath sounds normal. No stridor. No respiratory distress. He has no wheezes. He has no rales. He exhibits  no tenderness.  Musculoskeletal: He exhibits no edema.  Neurological: He is alert and oriented to person, place, and time. Coordination normal.  Psychiatric: He has a normal mood and affect. His behavior is normal. Judgment and thought content normal.  Nursing note and vitals reviewed.   ED Treatments / Results  Labs (all labs ordered are listed, but only abnormal results are displayed) Labs Reviewed - No data to display  EKG None  Radiology Dg Chest 2 View  Result Date: 04/23/2017 CLINICAL DATA:  Cough, wheezing and shortness of breath for 2 weeks EXAM: CHEST - 2 VIEW COMPARISON:  01/28/2015 FINDINGS: Normal heart size, mediastinal contours, and pulmonary vascularity. Lungs clear. No pleural effusion or pneumothorax. Bones unremarkable. IMPRESSION: Normal exam. Electronically Signed   By: Ulyses Southward M.D.   On: 04/23/2017 11:24    Procedures Procedures (including critical care time)  Medications Ordered in ED Medications - No data to display   Initial Impression / Assessment and Plan / ED Course  I have reviewed the triage vital signs and the nursing notes.  Pertinent labs & imaging results that were available during my care of the patient were reviewed by me and considered in my medical decision making (see chart for details).      Final Clinical Impressions(s) / ED Diagnoses   Final diagnoses:  Upper respiratory tract infection, unspecified type    Labs:   Imaging: DG chest   Consults:  Therapeutics:  Discharge Meds: Tessalon  Assessment/Plan: 27 year old male presents today with complaints of upper respiratory infection.  I have high suspicion that patient has a lingering cough rather than a true upper respiratory infection.  Patient is Artie been seen as an outpatient and referred here.  He has a chest x-ray that is normal lung sounds that are clear and reassuring physical exam.  Patient was [facility], he will  continue on with previous plan with antibiotic therapy.  I encouraged him to use cough medication as needed.  He will follow-up with a primary care if symptoms persist beyond antibiotic therapy or return if he develops any new or worsening signs or symptoms.  Patient verbalized understanding and agreement to today's plan had no further questions or concerns at the time of discharge.    ED Discharge Orders        Ordered    benzonatate (TESSALON) 100 MG capsule  Every 8 hours     04/23/17 1349       Eyvonne Mechanic, PA-C 04/23/17 1350    Tilden Fossa, MD 04/23/17 [phone]

## 2017-05-14 ENCOUNTER — Other Ambulatory Visit: Payer: Self-pay

## 2017-05-14 ENCOUNTER — Emergency Department (HOSPITAL_COMMUNITY)
Admission: EM | Admit: 2017-05-14 | Discharge: 2017-05-15 | Disposition: A | Discharge status: Home / Self Care | Payer: 59 | Attending: Emergency Medicine | Admitting: Emergency Medicine

## 2017-05-14 ENCOUNTER — Encounter (HOSPITAL_COMMUNITY): Payer: Self-pay | Admitting: Emergency Medicine

## 2017-05-14 DIAGNOSIS — F17210 Nicotine dependence, cigarettes, uncomplicated: Secondary | ICD-10-CM | POA: Diagnosis not present

## 2017-05-14 DIAGNOSIS — Z8719 Personal history of other diseases of the digestive system: Secondary | ICD-10-CM | POA: Diagnosis not present

## 2017-05-14 DIAGNOSIS — R1013 Epigastric pain: Secondary | ICD-10-CM | POA: Diagnosis present

## 2017-05-14 DIAGNOSIS — R1011 Right upper quadrant pain: Secondary | ICD-10-CM | POA: Diagnosis not present

## 2017-05-14 DIAGNOSIS — Z79899 Other long term (current) drug therapy: Secondary | ICD-10-CM | POA: Diagnosis not present

## 2017-05-14 LAB — COMPREHENSIVE METABOLIC PANEL
ALT: 33 U/L (ref 17–63)
ANION GAP: 6 (ref 5–15)
AST: 21 U/L (ref 15–41)
Albumin: 4.5 g/dL (ref 3.5–5.0)
Alkaline Phosphatase: 70 U/L (ref 38–126)
BILIRUBIN TOTAL: 0.5 mg/dL (ref 0.3–1.2)
BUN: 11 mg/dL (ref 6–20)
CO2: 27 mmol/L (ref 22–32)
Calcium: 9.5 mg/dL (ref 8.9–10.3)
Chloride: 107 mmol/L (ref 101–111)
Creatinine, Ser: 0.89 mg/dL (ref 0.61–1.24)
GFR calc Af Amer: >60 mL/min (ref >60)
GFR calc non Af Amer: >60 mL/min (ref >60)
Glucose, Bld: 95 mg/dL (ref 65–99)
POTASSIUM: 4.2 mmol/L (ref 3.5–5.1)
SODIUM: 140 mmol/L (ref 135–145)
TOTAL PROTEIN: 7 g/dL (ref 6.5–8.1)

## 2017-05-14 LAB — URINALYSIS, ROUTINE W REFLEX MICROSCOPIC
BILIRUBIN URINE: NEGATIVE
Glucose, UA: NEGATIVE mg/dL
Hgb urine dipstick: NEGATIVE
Ketones, ur: 5 mg/dL — AB
Leukocytes, UA: NEGATIVE
NITRITE: NEGATIVE
PH: 5 (ref 5.0–8.0)
Protein, ur: NEGATIVE mg/dL
SPECIFIC GRAVITY, URINE: 1.025 (ref 1.005–1.030)

## 2017-05-14 LAB — CBC
HEMATOCRIT: 45.1 % (ref 39.0–52.0)
HEMOGLOBIN: 16 g/dL (ref 13.0–17.0)
MCH: 30.2 pg (ref 26.0–34.0)
MCHC: 35.5 g/dL (ref 30.0–36.0)
MCV: 85.1 fL (ref 78.0–100.0)
Platelets: 190 10*3/uL (ref 150–400)
RBC: 5.3 MIL/uL (ref 4.22–5.81)
RDW: 12 % (ref 11.5–15.5)
WBC: 7.8 10*3/uL (ref 4.0–10.5)

## 2017-05-14 LAB — LIPASE, BLOOD: LIPASE: 41 U/L (ref 11–51)

## 2017-05-14 MED ORDER — GI COCKTAIL ~~LOC~~
30.0000 mL | Freq: Once | ORAL | Status: AC
  Administered 2017-05-14: 30 mL via ORAL
  Filled 2017-05-14: qty 30

## 2017-05-14 NOTE — ED Triage Notes (Signed)
Pt presents with upper mid abd pain today with nausea, pt reports loose x 3 that were black in color. Intermittent dizziness today.  Pt denies pepto, states he took Ibuprofen. Earlier today

## 2017-05-15 ENCOUNTER — Emergency Department (HOSPITAL_COMMUNITY): Payer: 59

## 2017-05-15 LAB — POC OCCULT BLOOD, ED: FECAL OCCULT BLD: NEGATIVE

## 2017-05-15 MED ORDER — FAMOTIDINE IN NACL 20-0.9 MG/50ML-% IV SOLN: 20.0000 mg | Freq: Once | INTRAVENOUS | Status: DC

## 2017-05-15 MED ORDER — SUCRALFATE 1 G PO TABS: ORAL_TABLET | ORAL | 0 refills | Status: DC

## 2017-05-15 MED ORDER — ONDANSETRON HCL 4 MG PO TABS: 4.0000 mg | ORAL_TABLET | Freq: Four times a day (QID) | ORAL | 0 refills | Status: DC

## 2017-05-15 MED ORDER — OMEPRAZOLE 40 MG PO CPDR: 40.0000 mg | DELAYED_RELEASE_CAPSULE | Freq: Every day | ORAL | 0 refills | Status: DC

## 2017-05-15 MED ORDER — SODIUM CHLORIDE 0.9 % IV BOLUS: 1000.0000 mL | Freq: Once | INTRAVENOUS | Status: DC

## 2017-05-15 MED ORDER — FAMOTIDINE 20 MG PO TABS: 40.0000 mg | ORAL_TABLET | Freq: Once | ORAL | Status: DC

## 2017-05-15 NOTE — Discharge Instructions (Signed)
Thank you for allowing me to care of you today in the emergency department.  Take 1 tablet of Zofran every 6 hours as needed for nausea and vomiting.   For the next 2 weeks, take 1 tablet of omeprazole by mouth daily.  You can also take 1 tablet of sulcralfate every 6 hours for the next 2 weeks to help coat your stomach and improve your pain.   You can take thousand milligrams of Tylenol once every 8 hours for pain.   If your symptoms do not start to improve within the next week with this regimen, call of our gastroenterology to schedule an appointment for follow-up.  If you develop worsening symptoms including severe, uncontrollable abdominal pain, lime green or blood in your vomit, vomiting despite taking Zofran, high fever despite taking Tylenol, or other new concerning symptoms, please return to the emergency department for reevaluation.

## 2017-05-15 NOTE — ED Notes (Signed)
Pt returned from US at this time.

## 2017-05-15 NOTE — ED Notes (Signed)
Pt transported to US via stretcher at this time.  

## 2017-05-15 NOTE — ED Notes (Signed)
ED Provider at bedside. 

## 2017-05-15 NOTE — ED Provider Notes (Signed)
[facility] EMERGENCY DEPARTMENT Provider Note   CSN: [id number] Arrival date & time: 05/14/17  2103     History   Chief Complaint Chief Complaint  Patient presents with  . Abdominal Pain    HPI Phillip Mcdonald is a 27 y.o. male with a history of obesity and GERD who presents to the emergency department with a chief complaint of abdominal pain.  The patient endorses sudden onset, waxing and waning, constant epigastric pain, characterized as stabbing, onset yesterday afternoon after having 3 episodes of dark, loose stools after lunchtime.  He states he doubled over from the intensity of the pain.  He noted some improvement with laying on his right side and aggravation of his pain with laying flat on his back and taking a deep breath.  He was at work when the pain began.  No history of similar.  He endorses associated nausea, NBNB emesis x3 that began after the onset of pain.  Last episode of emesis was prior to arrival in the ED.  Temperature was 100 (37.8) yesterday evening. He described the lightheadedness as feeling as if he was going to pass out that would last for a few seconds before resolving.  No known aggravating or alleviating factors.  He denies dysuria, hematemesis, hematochezia, back pain, hematuria, frequency, testicular or penile tenderness or swelling, chest pain, dyspnea.  He reports minimal, social alcohol use.  He occasionally takes NSAIDs, but denies recent NSAID use.  States that his last meal was a steak and potatoes for dinner the evening before the onset of pain.  Surgical history includes appendectomy.  Past medical history includes GERD, but he takes no daily medications.   He also notes that he found a tick on his right hip approximately 1 week ago.  Tick was not engorged and had been attached for an estimated 24 hours.  He denies rash, but notes that he had an intermittent headache over the last week.   The history is provided by the  patient. No language interpreter was used.    Past Medical History:  Diagnosis Date  . Appendicitis 05/2016  . Obesity     Patient Active Problem List   Diagnosis Date Noted  . Acute appendicitis 05/25/2016    Past Surgical History:  Procedure Laterality Date  . APPENDECTOMY    . LAPAROSCOPIC APPENDECTOMY N/A 05/25/2016   Procedure: APPENDECTOMY LAPAROSCOPIC;  Surgeon: Griselda Miner, MD;  Location: [facility] OR;  Service: General;  Laterality: N/A;  . WISDOM TOOTH EXTRACTION          Home Medications    Prior to Admission medications   Medication Sig Start Date End Date Taking? Authorizing Provider  benzonatate (TESSALON) 100 MG capsule Take 1 capsule (100 mg total) by mouth every 8 (eight) hours. 04/23/17   Hedges, Tinnie Gens, PA-C  ibuprofen (ADVIL,MOTRIN) 800 MG tablet Take 1 tablet (800 mg total) by mouth 3 (three) times daily. Patient not taking: Reported on 02/28/2017 06/22/16   Elson Areas, PA-C  methylPREDNISolone (MEDROL DOSEPAK) 4 MG TBPK tablet Taper over 6 days. Patient not taking: Reported on 02/28/2017 12/12/16   [name], PA-C  omeprazole (PRILOSEC) 40 MG capsule Take 1 capsule (40 mg total) by mouth daily. 05/15/17   McDonald, [name], PA-C  ondansetron (ZOFRAN) 4 MG tablet Take 1 tablet (4 mg total) by mouth every 6 (six) hours. 05/15/17   McDonald, [name], PA-C  promethazine (PHENERGAN) 25 MG tablet Take 1 tablet (25 mg total) by mouth  every 6 (six) hours as needed for nausea or vomiting. 02/28/17   Pisciotta, Joni Reining, PA-C  sucralfate (CARAFATE) 1 g tablet Take 1 tablet (4g) every 6 hours daily 05/15/17   McDonald, [name], PA-C  traMADol (ULTRAM) 50 MG tablet Take 1 tablet (50 mg total) by mouth every 6 (six) hours as needed. Patient not taking: Reported on 02/28/2017 12/12/16   [name], PA-C    Family History No family history on file.  Social History Social History   Tobacco Use  . Smoking status: Current Every Day Smoker    Packs/day: 0.50    Years: 4.00     Pack years: 2.00    Types: Cigarettes  . Smokeless tobacco: Never Used  Substance Use Topics  . Alcohol use: No  . Drug use: No     Allergies   Morphine and related; Shrimp [shellfish allergy]; and Nutrasweet aspartame [aspartame]   Review of Systems Review of Systems  Constitutional: Positive for fever. Negative for appetite change and chills.  HENT: Negative for congestion and sore throat.   Respiratory: Negative for shortness of breath.   Cardiovascular: Negative for chest pain.  Gastrointestinal: Positive for abdominal pain, diarrhea, nausea and vomiting. Negative for abdominal distention, anal bleeding, constipation and rectal pain.  Genitourinary: Negative for dysuria, flank pain, frequency, penile pain, penile swelling, testicular pain and urgency.  Musculoskeletal: Negative for back pain, myalgias, neck pain and neck stiffness.  Skin: Negative for rash.  Allergic/Immunologic: Negative for immunocompromised state.  Neurological: Positive for light-headedness and headaches. Negative for dizziness, syncope, weakness and numbness.  Psychiatric/Behavioral: Negative for confusion.   Physical Exam Updated Vital Signs BP 111/65 (BP Location: Right Arm)   Pulse 62   Temp (!) 97.5 F (36.4 C) (Oral)   Resp 18   Ht  (1.803 m)   Wt 129.3 kg (285 lb)   SpO2 98%   BMI 39.75 kg/m   Physical Exam  Constitutional: He is oriented to person, place, and time. He appears well-developed. No distress.  HENT:  Head: Normocephalic.  Eyes: Conjunctivae are normal. No scleral icterus.  Neck: Normal range of motion. Neck supple.  Cardiovascular: Normal rate, regular rhythm, normal heart sounds and intact distal pulses. Exam reveals no gallop and no friction rub.  No murmur heard. Pulmonary/Chest: Effort normal and breath sounds normal. No stridor. No respiratory distress. He has no wheezes. He has no rales. He exhibits no tenderness.  Abdominal: Soft. Bowel sounds are normal. He  exhibits no distension and no mass. There is tenderness in the right upper quadrant, epigastric area and left upper quadrant. There is no rebound and no guarding. No hernia.  Protuberant abdomen.  Abdomen is soft, nondistended.  Moderately tender to palpation in the right upper quadrant and left upper quadrant.  Mild discomfort with palpation in the left lower quadrant.  Right lower quadrant is nontender to palpation.  No rebound or guarding.  Positive Murphy sign.  No CVA tenderness bilaterally.  No hernias or organomegaly.  Genitourinary:  Genitourinary Comments: Chaperoned exam.  Normal tone of the anus.  No external hemorrhoids or palpable internal hemorrhoids.  Light brown stool is noted on exam.  No gross blood.  Musculoskeletal: Normal range of motion. He exhibits no edema, tenderness or deformity.  Neurological: He is alert and oriented to person, place, and time.  Skin: Skin is warm and dry. No rash noted. He is not diaphoretic. No erythema. No pallor.  Psychiatric: His behavior is normal.  Nursing note and  vitals reviewed.  ED Treatments / Results  Labs (all labs ordered are listed, but only abnormal results are displayed) Labs Reviewed  URINALYSIS, ROUTINE W REFLEX MICROSCOPIC - Abnormal; Notable for the following components:      Result Value   Ketones, ur 5 (*)    All other components within normal limits  LIPASE, BLOOD  COMPREHENSIVE METABOLIC PANEL  CBC  POC OCCULT BLOOD, ED    EKG None  Radiology US Abdomen Limited Ruq  Result Date: 05/15/2017 CLINICAL DATA:  Right upper quadrant pain EXAM: ULTRASOUND ABDOMEN LIMITED RIGHT UPPER QUADRANT COMPARISON:  CT abdomen and pelvis May 25, 2016 FINDINGS: Gallbladder: Gallbladder appears somewhat contracted. No gallstones are evident. The gallbladder wall is upper normal in thickness. No pericholecystic fluid. No sonographic Murphy sign noted by sonographer. Common bile duct: Diameter: 4 mm. No intrahepatic or extrahepatic biliary  duct dilatation. Liver: No focal lesion identified. Liver echogenicity is mildly inhomogeneous and overall increased. Portal vein is patent on color Doppler imaging with normal direction of blood flow towards the liver. IMPRESSION: 1. Gallbladder appears somewhat contracted. By report, the patient had not been NPO prior to this study. No gallbladder pathology is evident currently, although gallbladder wall is upper normal in thickness. If there remains concern for potential gallbladder pathology, would advise repeat study after minimum of 8 hours fasting. 2. Increased and mildly inhomogeneous liver echogenicity, likely due to hepatic steatosis. While no focal liver lesions are evident on this study, it must be cautioned that the sensitivity of ultrasound for detection of focal liver lesions is diminished in this circumstance. Electronically Signed   By: Bretta Bang III M.D.   On: 05/15/2017 07:43    Procedures Procedures (including critical care time)  Medications Ordered in ED Medications  famotidine (PEPCID) tablet 40 mg (has no administration in time range)  gi cocktail (Maalox,Lidocaine,Donnatal) (30 mLs Oral Given 05/14/17 2223)     Initial Impression / Assessment and Plan / ED Course  I have reviewed the triage vital signs and the nursing notes.  Pertinent labs & imaging results that were available during my care of the patient were reviewed by me and considered in my medical decision making (see chart for details).     27 year old male with a history of obesity and GERD presenting with abdominal pain, N/V, dark, loose stools, lightheadedness, and headache. Patient is nontoxic, nonseptic appearing, in no apparent distress.  Patient's pain and other symptoms adequately managed in emergency department.  Lightheadedness and headache have resolved without treatment.  Labs, imaging and vitals reviewed.  Patient does not meet the SIRS or Sepsis criteria.  On repeat exam patient does not have  a surgical abdomen and there are no peritoneal signs.  Right upper quadrant ultrasound is negative.  Fluid bolus offered, but the patient is tolerating p.o. fluids and is agreeable to oral hydration at home.  Suspect gastritis. no indication of appendicitis, bowel obstruction, bowel perforation, cholecystitis, diverticulitis.    Patient also found a tick attached on his right low back 1 week ago.  Estimated to be attached for 24 hours.  No thrombocytopenia.  Patient also has no rash and is afebrile in the ED.  Patient discharged home with symptomatic treatment and given strict instructions for follow-up with their primary care physician.  I have also discussed reasons to return immediately to the ER.  Patient expresses understanding and agrees with plan.  Final Clinical Impressions(s) / ED Diagnoses   Final diagnoses:  RUQ abdominal pain  Epigastric  abdominal pain  History of gastroesophageal reflux (GERD)    ED Discharge Orders        Ordered    ondansetron (ZOFRAN) 4 MG tablet  Every 6 hours     05/15/17 0825    omeprazole (PRILOSEC) 40 MG capsule  Daily     05/15/17 0825    sucralfate (CARAFATE) 1 g tablet     05/15/17 0825       McDonald, [name], PA-C 05/15/17 0845    Ward, Layla Maw, DO 05/15/17 2308

## 2017-08-05 ENCOUNTER — Other Ambulatory Visit: Payer: Self-pay

## 2017-08-05 ENCOUNTER — Emergency Department (HOSPITAL_COMMUNITY): Payer: 59

## 2017-08-05 ENCOUNTER — Encounter (HOSPITAL_COMMUNITY): Payer: Self-pay | Admitting: Emergency Medicine

## 2017-08-05 ENCOUNTER — Emergency Department (HOSPITAL_COMMUNITY)
Admission: EM | Admit: 2017-08-05 | Discharge: 2017-08-06 | Disposition: A | Discharge status: Home / Self Care | Payer: 59 | Attending: Emergency Medicine | Admitting: Emergency Medicine

## 2017-08-05 DIAGNOSIS — R0602 Shortness of breath: Secondary | ICD-10-CM | POA: Insufficient documentation

## 2017-08-05 DIAGNOSIS — R062 Wheezing: Secondary | ICD-10-CM | POA: Diagnosis not present

## 2017-08-05 DIAGNOSIS — F17210 Nicotine dependence, cigarettes, uncomplicated: Secondary | ICD-10-CM | POA: Insufficient documentation

## 2017-08-05 DIAGNOSIS — R05 Cough: Secondary | ICD-10-CM | POA: Insufficient documentation

## 2017-08-05 DIAGNOSIS — R059 Cough, unspecified: Secondary | ICD-10-CM

## 2017-08-05 LAB — CBC
HCT: 47.3 % (ref 39.0–52.0)
Hemoglobin: 16 g/dL (ref 13.0–17.0)
MCH: 29.5 pg (ref 26.0–34.0)
MCHC: 33.8 g/dL (ref 30.0–36.0)
MCV: 87.1 fL (ref 78.0–100.0)
PLATELETS: 184 10*3/uL (ref 150–400)
RBC: 5.43 MIL/uL (ref 4.22–5.81)
RDW: 11.7 % (ref 11.5–15.5)
WBC: 6.9 10*3/uL (ref 4.0–10.5)

## 2017-08-05 LAB — BASIC METABOLIC PANEL
Anion gap: 10 (ref 5–15)
BUN: 7 mg/dL (ref 6–20)
CHLORIDE: 111 mmol/L (ref 98–111)
CO2: 19 mmol/L — AB (ref 22–32)
CREATININE: 0.88 mg/dL (ref 0.61–1.24)
Calcium: 9.2 mg/dL (ref 8.9–10.3)
GFR calc non Af Amer: >60 mL/min (ref >60)
GLUCOSE: 138 mg/dL — AB (ref 70–99)
Potassium: 3.8 mmol/L (ref 3.5–5.1)
Sodium: 140 mmol/L (ref 135–145)

## 2017-08-05 LAB — I-STAT TROPONIN, ED: Troponin i, poc: 0 ng/mL (ref 0.00–0.08)

## 2017-08-05 MED ORDER — BENZONATATE 100 MG PO CAPS
100.0000 mg | ORAL_CAPSULE | Freq: Once | ORAL | Status: AC
Start: 2017-08-06 — End: 2017-08-06
  Administered 2017-08-06: 100 mg via ORAL
  Filled 2017-08-05: qty 1

## 2017-08-05 MED ORDER — IPRATROPIUM-ALBUTEROL 0.5-2.5 (3) MG/3ML IN SOLN
3.0000 mL | Freq: Once | RESPIRATORY_TRACT | Status: AC
  Administered 2017-08-06: 3 mL via RESPIRATORY_TRACT
  Filled 2017-08-05: qty 3

## 2017-08-05 MED ORDER — PREDNISONE 20 MG PO TABS
40.0000 mg | ORAL_TABLET | Freq: Once | ORAL | Status: AC
  Administered 2017-08-06: 40 mg via ORAL
  Filled 2017-08-05: qty 2

## 2017-08-05 NOTE — ED Triage Notes (Signed)
Pt reports a productive green cough and orthopnea that has been going on for 2-3 months. Pt reports 5/10 chest tightness that makes it difficult to take a deep breath. Pt also reports dyspnea with exertion with dizzy spells/"seeing stars".

## 2017-08-06 LAB — BRAIN NATRIURETIC PEPTIDE: B NATRIURETIC PEPTIDE 5: 16.5 pg/mL (ref 0.0–100.0)

## 2017-08-06 MED ORDER — ALBUTEROL SULFATE HFA 108 (90 BASE) MCG/ACT IN AERS
1.0000 | INHALATION_SPRAY | Freq: Once | RESPIRATORY_TRACT | Status: AC
  Administered 2017-08-06: 1 via RESPIRATORY_TRACT
  Filled 2017-08-06: qty 6.7

## 2017-08-06 MED ORDER — PREDNISONE 20 MG PO TABS: 40.0000 mg | ORAL_TABLET | Freq: Every day | ORAL | 0 refills | Status: DC

## 2017-08-06 MED ORDER — AZITHROMYCIN 250 MG PO TABS: ORAL_TABLET | ORAL | 0 refills | Status: DC

## 2017-08-06 MED ORDER — BENZONATATE 100 MG PO CAPS: 100.0000 mg | ORAL_CAPSULE | Freq: Three times a day (TID) | ORAL | 0 refills | Status: DC

## 2017-08-06 NOTE — ED Notes (Signed)
No dizziness or SOB stated by the patient, Sats at 96-100% for entirety of walk.

## 2017-08-06 NOTE — Discharge Instructions (Addendum)
Your work-up has been reassuring in the emergency department today.  I suspect that your symptoms may be related to a bronchitis given your history of tobacco use.  Take the antibiotic to prevent infection such as pneumonia.  Use the steroid starting tomorrow for the next 4 days.  Also use the inhaler as needed for wheezing or shortness of breath.

## 2017-08-06 NOTE — ED Provider Notes (Signed)
[facility] Provider Note   CSN: [id number] Arrival date & time: 08/05/17  2050     History   Chief Complaint Chief Complaint  Patient presents with  . Cough  . Shortness of Breath    HPI Phillip Mcdonald is a 27 y.o. male.  HPI 27 year old Caucasian male with no pertinent past medical history presents to the emergency department today for evaluation of 2 to 3 months of ongoing productive cough with yellow-green sputum, wheezing, shortness of breath, orthopnea, chest tightness.  He states that these are intermittent.  Reports worse with exertion.  Patient states that when he lies down at night this can make his symptoms worse.  He states that he has some chest tightness which makes it difficult to take a deep breath at times.  Patient currently denies tobacco use but does have a history of a 7-year pack a day smoker.  Denies any known lung conditions.  Patient denies any lower extremity swelling or calf tenderness.  He has no history of DVT/PE, prolonged immobilization, recent hospitalization/surgeries, unilateral leg swelling or calf tenderness, hemoptysis.  Patient denies any cardiac history.  He has not tried anything at home for his symptoms.  Nothing makes better.  He also reports rhinorrhea with some nasal congestion at times and postnasal drip.  Pt denies any fever, chill, ha, vision changes, lightheadedness, dizziness, congestion, neck pain, abd pain, n/v/d, urinary symptoms, change in bowel habits, melena, hematochezia, lower extremity paresthesias.  Past Medical History:  Diagnosis Date  . Appendicitis 05/2016  . Obesity     Patient Active Problem List   Diagnosis Date Noted  . Acute appendicitis 05/25/2016    Past Surgical History:  Procedure Laterality Date  . APPENDECTOMY    . LAPAROSCOPIC APPENDECTOMY N/A 05/25/2016   Procedure: APPENDECTOMY LAPAROSCOPIC;  Surgeon: Griselda Mineroth, Paul III, MD;  [facility];  Service: General;   Laterality: N/A;  . WISDOM TOOTH EXTRACTION          Home Medications    Prior to Admission medications   Medication Sig Start Date End Date Taking? Authorizing Provider  promethazine (PHENERGAN) 25 MG tablet Take 1 tablet (25 mg total) by mouth every 6 (six) hours as needed for nausea or vomiting. 02/28/17  Yes Pisciotta, Joni ReiningNicole, PA-C  azithromycin (ZITHROMAX) 250 MG tablet Take 2 tabs PO x 1 dose, then 1 tab PO QD x 4 days 08/06/17   Demetrios LollLeaphart, Sarra Rachels T, PA-C  benzonatate (TESSALON) 100 MG capsule Take 1 capsule (100 mg total) by mouth every 8 (eight) hours. 08/06/17   Rise MuLeaphart, Myriam Brandhorst T, PA-C  ibuprofen (ADVIL,MOTRIN) 800 MG tablet Take 1 tablet (800 mg total) by mouth 3 (three) times daily. Patient not taking: Reported on 02/28/2017 06/22/16   Elson AreasSofia, Leslie K, PA-C  methylPREDNISolone (MEDROL DOSEPAK) 4 MG TBPK tablet Taper over 6 days. Patient not taking: Reported on 02/28/2017 12/12/16   Dietrich PatesKhatri, Hina, PA-C  omeprazole (PRILOSEC) 40 MG capsule Take 1 capsule (40 mg total) by mouth daily. Patient not taking: Reported on 08/06/2017 05/15/17   McDonald, [name], PA-C  ondansetron (ZOFRAN) 4 MG tablet Take 1 tablet (4 mg total) by mouth every 6 (six) hours. Patient not taking: Reported on 08/06/2017 05/15/17   McDonald, Pedro EarlsMia A, PA-C  predniSONE (DELTASONE) 20 MG tablet Take 2 tablets (40 mg total) by mouth daily. 08/06/17   Rise MuLeaphart, Georgiann Neider T, PA-C  sucralfate (CARAFATE) 1 g tablet Take 1 tablet (4g) every 6 hours daily Patient not taking: Reported  on 08/06/2017 05/15/17   McDonald, Pedro Earls A, PA-C  traMADol (ULTRAM) 50 MG tablet Take 1 tablet (50 mg total) by mouth every 6 (six) hours as needed. Patient not taking: Reported on 02/28/2017 12/12/16   [name], PA-C    Family History No family history on file.  Social History Social History   Tobacco Use  . Smoking status: Current Every Day Smoker    Packs/day: 0.50    Years: 4.00    Pack years: 2.00    Types: Cigarettes  . Smokeless tobacco:  Never Used  Substance Use Topics  . Alcohol use: No  . Drug use: No     Allergies   Morphine and related; Shrimp [shellfish allergy]; and Nutrasweet aspartame [aspartame]   Review of Systems Review of Systems  All other systems reviewed and are negative.    Physical Exam Updated Vital Signs BP 125/73 (BP Location: Right Arm)   Pulse 76   Temp 98.4 F (36.9 C) (Oral)   Resp 20   Ht 5\' 11"  (1.803 m)   Wt 122.5 kg (270 lb)   SpO2 100%   BMI 37.66 kg/m   Physical Exam  Constitutional: He is oriented to person, place, and time. He appears well-developed and well-nourished.  Non-toxic appearance. No distress.  HENT:  Head: Normocephalic and atraumatic.  Nose: Nose normal.  Mouth/Throat: Oropharynx is clear and moist.  Eyes: Pupils are equal, round, and reactive to light. Conjunctivae are normal. Right eye exhibits no discharge. Left eye exhibits no discharge.  Neck: Normal range of motion. Neck supple. No JVD present. No tracheal deviation present.  Cardiovascular: Normal rate, regular rhythm, normal heart sounds and intact distal pulses. Exam reveals no gallop and no friction rub.  No murmur heard. Pulmonary/Chest: Effort normal. No stridor. No tachypnea. No respiratory distress. He has no decreased breath sounds. He has wheezes (expiratory). He has rhonchi (throughout). He has no rales. He exhibits no tenderness.  No hypoxia or tachypnea.  Abdominal: Soft. Bowel sounds are normal. He exhibits no distension. There is no tenderness. There is no rebound and no guarding.  Musculoskeletal: Normal range of motion.  No lower extremity edema or calf tenderness.  Lymphadenopathy:    He has no cervical adenopathy.  Neurological: He is alert and oriented to person, place, and time.  Skin: Skin is warm and dry. Capillary refill takes less than 2 seconds. He is not diaphoretic.  Psychiatric: His behavior is normal. Judgment and thought content normal.  Nursing note and vitals  reviewed.    ED Treatments / Results  Labs (all labs ordered are listed, but only abnormal results are displayed) Labs Reviewed  BASIC METABOLIC PANEL - Abnormal; Notable for the following components:      Result Value   CO2 19 (*)    Glucose, Bld 138 (*)    All other components within normal limits  CBC  BRAIN NATRIURETIC PEPTIDE  I-STAT TROPONIN, ED    EKG EKG Interpretation  Date/Time:  Sunday August 05 2017 21:28:41 EDT Ventricular Rate:  80 PR Interval:  186 QRS Duration: 82 QT Interval:  342 QTC Calculation: 394 R Axis:   32 Text Interpretation:  Normal sinus rhythm Cannot rule out Anterior infarct , age undetermined Abnormal ECG Confirmed by [name] ([id number]) on 08/05/2017 11:36:25 PM   Radiology Dg Chest 2 View  Result Date: 08/05/2017 CLINICAL DATA:  Chest pain with cough EXAM: CHEST - 2 VIEW COMPARISON:  April 23, 2017 FINDINGS: Lungs are clear. Heart size  and pulmonary vascularity are normal. No adenopathy. No pneumothorax. No bone lesions. IMPRESSION: No edema or consolidation. Electronically Signed   By: Bretta Bang III M.D.   On: 08/05/2017 21:53    Procedures Procedures (including critical care time)  Medications Ordered in ED Medications  ipratropium-albuterol (DUONEB) 0.5-2.5 (3) MG/3ML nebulizer solution 3 mL (3 mLs Nebulization Given 08/06/17 0008)  predniSONE (DELTASONE) tablet 40 mg (40 mg Oral Given 08/06/17 0009)  benzonatate (TESSALON) capsule 100 mg (100 mg Oral Given 08/06/17 0009)  albuterol (PROVENTIL HFA;VENTOLIN HFA) 108 (90 Base) MCG/ACT inhaler 1 puff (1 puff Inhalation Given 08/06/17 0104)     Initial Impression / Assessment and Plan / ED Course  I have reviewed the triage vital signs and the nursing notes.  Pertinent labs & imaging results that were available during my care of the patient were reviewed by me and considered in my medical decision making (see chart for details).     Patient resents to the ED for evaluation  of intermittent chest pain, shortness of breath, wheezing, productive cough, rhinorrhea, orthopnea that has been ongoing for 2 to 3 months.  Patient denies any cardiac history.  He is PERC negative.  Vital signs reassuring in the emergency department today.  He is afebrile without hypoxia, tachycardia or hypotension.  Patient does have some end expiratory wheezes noted on exam with rhonchorous sounds throughout.  No tachypnea or accessory muscle use.  Regular rate and rhythm of heart without any rubs murmurs or gallops.  Neurovascularly intact in all extremities.  Work-up reassuring in the emergency department today.  No leukocytosis.  No anemia noted.  No significant electrolyte derangement.  Negative troponin.  Heart pathway score is 1.  Clinical presentation for ACS is very low at this time.  EKG shows normal sinus rhythm without any signs of acute ischemia.  BNP is normal.  Chest x-ray shows no signs of focal infiltrate or pulmonary edema.  I suspect patient symptoms likely secondary from acute versus chronic bronchitis given his significant smoking history.  Patient was able to ambulate in the ED without any hypoxia.  Clinical presentation for PE, ACS or dissection is very low at this time.  I feel this is a chronic condition and need to be followed up with a primary care doctor for possible pulmonary function test.  Patient given breathing treatment, steroids in the ED.  Lung sounds improved.  Patient had some improvement in his symptoms.  Will discharge home with antibiotics, albuterol inhaler and steroids.  Pt is hemodynamically stable, in NAD, & able to ambulate in the ED. Evaluation does not show pathology that would require ongoing emergent intervention or inpatient treatment. I explained the diagnosis to the patient. Pain has been managed & has no complaints prior to dc. Pt is comfortable with above plan and is stable for discharge at this time. All questions were answered prior to disposition. Strict  return precautions for f/u to the ED were discussed. Encouraged follow up with PCP.   Final Clinical Impressions(s) / ED Diagnoses   Final diagnoses:  Cough  Shortness of breath  Wheezing    ED Discharge Orders        Ordered    benzonatate (TESSALON) 100 MG capsule  Every 8 hours     08/06/17 0057    predniSONE (DELTASONE) 20 MG tablet  Daily     08/06/17 0057    azithromycin (ZITHROMAX) 250 MG tablet     08/06/17 0058  [name], PA-C 08/06/17 0106    [name], MD 08/06/17 [phone]

## 2017-08-17 ENCOUNTER — Ambulatory Visit (HOSPITAL_COMMUNITY)
Admission: EM | Admit: 2017-08-17 | Discharge: 2017-08-17 | Disposition: A | Discharge status: Home / Self Care | Payer: 59 | Source: Home / Self Care | Attending: Internal Medicine | Admitting: Internal Medicine

## 2017-08-17 ENCOUNTER — Encounter (HOSPITAL_COMMUNITY): Payer: Self-pay | Admitting: Radiology

## 2017-08-17 ENCOUNTER — Other Ambulatory Visit: Payer: Self-pay

## 2017-08-17 ENCOUNTER — Emergency Department (HOSPITAL_COMMUNITY)
Admission: EM | Admit: 2017-08-17 | Discharge: 2017-08-18 | Disposition: A | Discharge status: Home / Self Care | Payer: 59 | Attending: Emergency Medicine | Admitting: Emergency Medicine

## 2017-08-17 ENCOUNTER — Emergency Department (HOSPITAL_COMMUNITY): Payer: 59

## 2017-08-17 ENCOUNTER — Encounter (HOSPITAL_COMMUNITY): Payer: Self-pay

## 2017-08-17 DIAGNOSIS — F17210 Nicotine dependence, cigarettes, uncomplicated: Secondary | ICD-10-CM | POA: Insufficient documentation

## 2017-08-17 DIAGNOSIS — R519 Headache, unspecified: Secondary | ICD-10-CM

## 2017-08-17 DIAGNOSIS — G4489 Other headache syndrome: Secondary | ICD-10-CM | POA: Insufficient documentation

## 2017-08-17 DIAGNOSIS — J0110 Acute frontal sinusitis, unspecified: Secondary | ICD-10-CM | POA: Diagnosis not present

## 2017-08-17 DIAGNOSIS — R51 Headache: Secondary | ICD-10-CM | POA: Diagnosis not present

## 2017-08-17 LAB — CBC WITH DIFFERENTIAL/PLATELET
ABS IMMATURE GRANULOCYTES: 0.1 10*3/uL (ref 0.0–0.1)
Basophils Absolute: 0.1 10*3/uL (ref 0.0–0.1)
Basophils Relative: 1 %
Eosinophils Absolute: 0.2 10*3/uL (ref 0.0–0.7)
Eosinophils Relative: 2 %
HCT: 46.5 % (ref 39.0–52.0)
HEMOGLOBIN: 15.6 g/dL (ref 13.0–17.0)
IMMATURE GRANULOCYTES: 1 %
LYMPHS ABS: 3 10*3/uL (ref 0.7–4.0)
LYMPHS PCT: 29 %
MCH: 28.9 pg (ref 26.0–34.0)
MCHC: 33.5 g/dL (ref 30.0–36.0)
MCV: 86.3 fL (ref 78.0–100.0)
Monocytes Absolute: 0.7 10*3/uL (ref 0.1–1.0)
Monocytes Relative: 7 %
NEUTROS PCT: 60 %
Neutro Abs: 6.2 10*3/uL (ref 1.7–7.7)
PLATELETS: 206 10*3/uL (ref 150–400)
RBC: 5.39 MIL/uL (ref 4.22–5.81)
RDW: 11.7 % (ref 11.5–15.5)
WBC: 10.2 10*3/uL (ref 4.0–10.5)

## 2017-08-17 LAB — BASIC METABOLIC PANEL
Anion gap: 12 (ref 5–15)
BUN: 10 mg/dL (ref 6–20)
CHLORIDE: 107 mmol/L (ref 98–111)
CO2: 21 mmol/L — ABNORMAL LOW (ref 22–32)
Calcium: 9.6 mg/dL (ref 8.9–10.3)
Creatinine, Ser: 1.01 mg/dL (ref 0.61–1.24)
GFR calc Af Amer: >60 mL/min (ref >60)
GFR calc non Af Amer: >60 mL/min (ref >60)
Glucose, Bld: 80 mg/dL (ref 70–99)
POTASSIUM: 4.2 mmol/L (ref 3.5–5.1)
Sodium: 140 mmol/L (ref 135–145)

## 2017-08-17 NOTE — ED Provider Notes (Signed)
[facility]   [id number] 08/17/17 Arrival Time: 1641  SUBJECTIVE:  Phillip Mcdonald is a 27 y.o. male who complains of worsening migraine x 6 days.  Denies a precipitating event, or recent head trauma.  Patient localizes his pain to the left side of his face, and particularly around the left eye.  Describes the pain as constant and throbbing in character.  Patient has tried ibuprofen 350 mg without relief. Symptoms are made worse with light.  Denies similar symptoms in the past.  This is the worst headache of his life.  Complains of associated nausea, vomiting, and vision changes.  Patient denies fever, chills, aura, rhinorrhea, watery eyes, chest pain, SOB, abdominal pain, weakness, numbness or tingling.    ROS: As per HPI.  Past Medical History:  Diagnosis Date  . Appendicitis 05/2016  . Obesity    Past Surgical History:  Procedure Laterality Date  . APPENDECTOMY    . LAPAROSCOPIC APPENDECTOMY N/A 05/25/2016   Procedure: APPENDECTOMY LAPAROSCOPIC;  Surgeon: Griselda Miner, MD;  Location: [facility] OR;  Service: General;  Laterality: N/A;  . WISDOM TOOTH EXTRACTION     Allergies  Allergen Reactions  . Morphine And Related Other (See Comments)    "made him feel awful"  . Shrimp [Shellfish Allergy] Itching and Swelling  . Nutrasweet Aspartame [Aspartame] Other (See Comments)    Unknown   No current facility-administered medications on file prior to encounter.    Current Outpatient Medications on File Prior to Encounter  Medication Sig Dispense Refill  . ibuprofen (ADVIL,MOTRIN) 800 MG tablet Take 1 tablet (800 mg total) by mouth 3 (three) times daily. 21 tablet 0  . azithromycin (ZITHROMAX) 250 MG tablet Take 2 tabs PO x 1 dose, then 1 tab PO QD x 4 days 6 tablet 0  . benzonatate (TESSALON) 100 MG capsule Take 1 capsule (100 mg total) by mouth every 8 (eight) hours. 21 capsule 0  . methylPREDNISolone (MEDROL DOSEPAK) 4 MG TBPK tablet Taper over 6 days. (Patient not taking:  Reported on 02/28/2017) 21 tablet 0  . omeprazole (PRILOSEC) 40 MG capsule Take 1 capsule (40 mg total) by mouth daily. (Patient not taking: Reported on 08/06/2017) 14 capsule 0  . ondansetron (ZOFRAN) 4 MG tablet Take 1 tablet (4 mg total) by mouth every 6 (six) hours. (Patient not taking: Reported on 08/06/2017) 12 tablet 0  . predniSONE (DELTASONE) 20 MG tablet Take 2 tablets (40 mg total) by mouth daily. 8 tablet 0  . promethazine (PHENERGAN) 25 MG tablet Take 1 tablet (25 mg total) by mouth every 6 (six) hours as needed for nausea or vomiting. 12 tablet 0  . sucralfate (CARAFATE) 1 g tablet Take 1 tablet (4g) every 6 hours daily (Patient not taking: Reported on 08/06/2017) 56 tablet 0  . traMADol (ULTRAM) 50 MG tablet Take 1 tablet (50 mg total) by mouth every 6 (six) hours as needed. (Patient not taking: Reported on 02/28/2017) 15 tablet 0   Social History   Socioeconomic History  . Marital status: Married    Spouse name: Not on file  . Number of children: Not on file  . Years of education: Not on file  . Highest education level: Not on file  Occupational History  . Not on file  Social Needs  . Financial resource strain: Not on file  . Food insecurity:    Worry: Not on file    Inability: Not on file  . Transportation needs:    Medical: Not on file  Non-medical: Not on file  Tobacco Use  . Smoking status: Current Every Day Smoker    Packs/day: 0.50    Years: 4.00    Pack years: 2.00    Types: Cigarettes  . Smokeless tobacco: Never Used  Substance and Sexual Activity  . Alcohol use: No  . Drug use: No  . Sexual activity: Not on file  Lifestyle  . Physical activity:    Days per week: Not on file    Minutes per session: Not on file  . Stress: Not on file  Relationships  . Social connections:    Talks on phone: Not on file    Gets together: Not on file    Attends religious service: Not on file    Active member of club or organization: Not on file    Attends meetings of  clubs or organizations: Not on file    Relationship status: Not on file  . Intimate partner violence:    Fear of current or ex partner: Not on file    Emotionally abused: Not on file    Physically abused: Not on file    Forced sexual activity: Not on file  Other Topics Concern  . Not on file  Social History Narrative  . Not on file   History reviewed. No pertinent family history.  OBJECTIVE:  Vitals:   08/17/17 1758 08/17/17 1801  BP: (!) 137/94   Pulse: 78   Resp: 17   Temp: 98.2 F (36.8 C)   TempSrc: Oral   SpO2: 98% 98%    General appearance: alert; no distress; appears uncomfortable lying on exam table with hands covering face Eyes: PERRLA; EOMI HENT: normocephalic; atraumatic; tender to palpation over the left supraorbital region of the frontal bone Neck: supple with FROM Lungs: clear to auscultation bilaterally Heart: regular rate and rhythm.  Radial pulses 2+ symmetrical bilaterally Extremities: no edema; symmetrical with no gross deformities Skin: warm and dry Neurologic: CN 2-12 grossly intact; finger to nose without difficulty; normal gait; strength intact bilaterally about the upper and lower extremities; reports numbness of the left UE, with sensation intact about the other extremities Psychological: alert and cooperative; normal mood and affect   ASSESSMENT & PLAN:  1. Worst headache of life     No orders of the defined types were placed in this encounter.  Recommend further evaluation and management in the ED due to this being the worst HA of the patient's life Patient aware and in agreement with this plan Transported in wheelchair by ancillary staff to ED  Reviewed expectations re: course of current medical issues. Questions answered. Outlined signs and symptoms indicating need for more acute intervention. Patient verbalized understanding. After Visit Summary given.   Rennis HardingWurst, Dayvian Blixt, PA-C 08/17/17 1844

## 2017-08-17 NOTE — ED Triage Notes (Signed)
Sent from urgent care due to a severe headache. Pt stated it has been going on for 6 days and today was so severe he came to hospital. Blurred vision in left eye and sensitivity to light. Head hurts to move fast. Some numbness in the left arm.

## 2017-08-17 NOTE — Discharge Instructions (Addendum)
Recommend further evaluation and management in the ED due to this being the worst HA of the patient's life Patient aware and in agreement with this plan Transported in wheelchair by ancillary staff to ED

## 2017-08-17 NOTE — ED Triage Notes (Signed)
[facility], pt states he was seen in the ED on 08/05/17 for bronchitis, but now is having bad headache, pt states feels like he has been hit in the head. Pt has taken Ibuprofen 350 mg but has no relief

## 2017-08-17 NOTE — ED Provider Notes (Signed)
Patient placed in Quick Look pathway, seen and evaluated   Chief Complaint: Headache  HPI:   The patient reports 6 days of L sided headache. He states it's over the whole left side of face and head and it feels like someone is using his head as a punching bag. It is affecting his L eye as well. He reports acute vision loss today followed by blurry vision and spots. He's had nausea and vomiting.  Pain was so severe today he went to UC and was referred here since it's worse HA of life. No fever or trauma  ROS: +headache  Physical Exam:   Gen: No distress  Neuro: Awake and Alert  Skin: Warm    Focused Exam: Pain with pupil testing. EOM intact but cause pain. Ambulatory. 5/5 strength in all extremities   Initiation of care has begun. The patient has been counseled on the process, plan, and necessity for staying for the completion/evaluation, and the remainder of the medical screening examination    Bethel BornGekas, Aldine Chakraborty Marie, PA-C 08/17/17 1916    Shaune PollackIsaacs, Cameron, MD 08/17/17 [phone]

## 2017-08-18 MED ORDER — METOCLOPRAMIDE HCL 5 MG/ML IJ SOLN
10.0000 mg | Freq: Once | INTRAMUSCULAR | Status: AC
  Administered 2017-08-18: 10 mg via INTRAVENOUS

## 2017-08-18 MED ORDER — KETOROLAC TROMETHAMINE 30 MG/ML IJ SOLN
15.0000 mg | Freq: Once | INTRAMUSCULAR | Status: AC
  Administered 2017-08-18: 15 mg via INTRAVENOUS

## 2017-08-18 MED ORDER — HYDROCODONE-ACETAMINOPHEN 5-325 MG PO TABS: 1.0000 | ORAL_TABLET | Freq: Four times a day (QID) | ORAL | 0 refills | Status: DC | PRN

## 2017-08-18 MED ORDER — METOCLOPRAMIDE HCL 5 MG/ML IJ SOLN
INTRAMUSCULAR | Status: AC
  Filled 2017-08-18: qty 2

## 2017-08-18 MED ORDER — DIPHENHYDRAMINE HCL 50 MG/ML IJ SOLN
INTRAMUSCULAR | Status: DC
Start: 2017-08-18 — End: 2017-08-18
  Filled 2017-08-18: qty 1

## 2017-08-18 MED ORDER — KETOROLAC TROMETHAMINE 15 MG/ML IJ SOLN
INTRAMUSCULAR | Status: AC
  Administered 2017-08-18: 15 mg
  Filled 2017-08-18: qty 1

## 2017-08-18 MED ORDER — DIPHENHYDRAMINE HCL 50 MG/ML IJ SOLN
25.0000 mg | Freq: Once | INTRAMUSCULAR | Status: AC
Start: 2017-08-18 — End: 2017-08-18
  Administered 2017-08-18: 25 mg via INTRAVENOUS

## 2017-08-18 MED ORDER — AMOXICILLIN-POT CLAVULANATE 875-125 MG PO TABS: 1.0000 | ORAL_TABLET | Freq: Two times a day (BID) | ORAL | 0 refills | Status: DC

## 2017-08-18 MED ORDER — SODIUM CHLORIDE 0.9 % IV SOLN
3.0000 g | Freq: Once | INTRAVENOUS | Status: AC
  Administered 2017-08-18: 3 g via INTRAVENOUS
  Filled 2017-08-18: qty 3

## 2017-08-18 NOTE — ED Provider Notes (Signed)
[facility] EMERGENCY DEPARTMENT Provider Note   CSN: [id number] Arrival date & time: 08/17/17  1832     History   Chief Complaint Chief Complaint  Patient presents with  . Headache    HPI Phillip Mcdonald is a 27 y.o. male.  The history is provided by the patient.  Headache   This is a new problem. The current episode started more than 2 days ago. The problem occurs constantly. The problem has been gradually worsening. The pain is located in the frontal region. The quality of the pain is described as throbbing. The pain is severe. Associated symptoms include nausea. Pertinent negatives include no fever. He has tried NSAIDs for the symptoms. The treatment provided no relief.   Patient presents for headache for the past 6 days.  He reports it began very gradually and has slowly gotten worse for the past 6 days.  He reports it first started as a "simple headache" No fever.  No head trauma.  No recent travel.  No tick bites.  No rash.  No focal weakness.  He does report over the past few days been having increasing blurred vision mostly in the left side.  He also reports some facial pain as well as forehead pain.  No recent sinus congestion is reported.  He does report a recent history of bronchitis has been taking medications, but never finished antibiotics. Past Medical History:  Diagnosis Date  . Appendicitis 05/2016  . Obesity     Patient Active Problem List   Diagnosis Date Noted  . Acute appendicitis 05/25/2016    Past Surgical History:  Procedure Laterality Date  . APPENDECTOMY    . LAPAROSCOPIC APPENDECTOMY N/A 05/25/2016   Procedure: APPENDECTOMY LAPAROSCOPIC;  Surgeon: Griselda Miner, MD;  Location: [facility] OR;  Service: General;  Laterality: N/A;  . WISDOM TOOTH EXTRACTION          Home Medications    Prior to Admission medications   Medication Sig Start Date End Date Taking? Authorizing Provider  amoxicillin-clavulanate (AUGMENTIN) 875-125 MG  tablet Take 1 tablet by mouth 2 (two) times daily. One po bid x 10 days 08/18/17   [name], MD  HYDROcodone-acetaminophen (NORCO/VICODIN) 5-325 MG tablet Take 1 tablet by mouth every 6 (six) hours as needed for severe pain. 08/18/17   [name], MD  omeprazole (PRILOSEC) 40 MG capsule Take 1 capsule (40 mg total) by mouth daily. Patient not taking: Reported on 08/06/2017 05/15/17   McDonald, Pedro Earls A, PA-C  predniSONE (DELTASONE) 20 MG tablet Take 2 tablets (40 mg total) by mouth daily. 08/06/17   [name], PA-C  traMADol (ULTRAM) 50 MG tablet Take 1 tablet (50 mg total) by mouth every 6 (six) hours as needed. Patient not taking: Reported on 02/28/2017 12/12/16   [name], PA-C    Family History No family history on file.  Social History Social History   Tobacco Use  . Smoking status: Current Every Day Smoker    Packs/day: 0.50    Years: 4.00    Pack years: 2.00    Types: Cigarettes  . Smokeless tobacco: Never Used  Substance Use Topics  . Alcohol use: No  . Drug use: No     Allergies   Morphine and related; Shrimp [shellfish allergy]; and Nutrasweet aspartame [aspartame]   Review of Systems Review of Systems  Constitutional: Negative for fever.  HENT:       Facial pain  Eyes: Positive for visual disturbance.  Cardiovascular: Negative  for chest pain.  Gastrointestinal: Positive for nausea.  Skin: Negative for rash.  Neurological: Positive for headaches. Negative for syncope and weakness.  All other systems reviewed and are negative.    Physical Exam Updated Vital Signs BP 128/81   Pulse 78   Temp 98.2 F (36.8 C)   Resp 16   Ht 1.803 m (5\' 11" )   Wt 122.5 kg (270 lb)   SpO2 100%   BMI 37.66 kg/m   Physical Exam  CONSTITUTIONAL: Well developed/well nourished HEAD: Normocephalic/atraumatic, diffuse tenderness to forehead, worse on left side.  No crepitus or bogginess. EYES: EOMI/PERRL, no nystagmus, no ptosis, normal fundoscopic exam  (no papilledema), no consensual pain No corneal hazing, no conjunctival erythema No Proptosis ENMT: Mucous membranes moist, redness noted throughout left maxilla, but no crepitus or bruising or bogginess NECK: supple no meningeal signs, no bruits SPINE/BACK:entire spine nontender CV: S1/S2 noted, no murmurs/rubs/gallops noted LUNGS: Lungs are clear to auscultation bilaterally, no apparent distress ABDOMEN: soft, nontender, no rebound or guarding GU:no cva tenderness NEURO:Awake/alert, face symmetric, no arm or leg drift is noted Equal 5/5 strength with shoulder abduction, elbow flex/extension, wrist flex/extension in upper extremities and equal hand grips bilaterally Equal 5/5 strength with hip flexion,knee flex/extension, foot dorsi/plantar flexion Cranial nerves 3/4/5/6/07/24/08/11/12 tested and intact Gait normal without ataxia No past pointing Sensation to light touch intact in all extremities EXTREMITIES: pulses normal, full ROM SKIN: warm, color normal PSYCH: no abnormalities of mood noted, alert and oriented to situation   ED Treatments / Results  Labs (all labs ordered are listed, but only abnormal results are displayed) Labs Reviewed  BASIC METABOLIC PANEL - Abnormal; Notable for the following components:      Result Value   CO2 21 (*)    All other components within normal limits  CBC WITH DIFFERENTIAL/PLATELET    EKG None  Radiology Ct Head Wo Contrast  Result Date: 08/17/2017 CLINICAL DATA:  Migraine for 6 days.  Blurred vision in LEFT eye. EXAM: CT HEAD WITHOUT CONTRAST TECHNIQUE: Contiguous axial images were obtained from the base of the skull through the vertex without intravenous contrast. COMPARISON:  09/08/2013. FINDINGS: Brain: No evidence of acute infarction, hemorrhage, hydrocephalus, extra-axial collection or mass lesion/mass effect. Normal cerebral volume. No white matter disease. Vascular: No hyperdense vessel or unexpected calcification. Skull: Normal.  Negative for fracture or focal lesion. Sinuses/Orbits: Negative orbits. In the paranasal sinuses, there is significant opacity. There is chronic mucosal thickening in the LEFT maxillary sinus, with superimposed acute layering fluid in both the LEFT (greater) and RIGHT (smaller) maxillary sinuses. There is significant near complete opacification of the LEFT anterior and posterior ethmoid air cells, with less prominent but still significant ethmoid air cell opacity on the RIGHT. Layering fluid in LEFT frontal sinus with foamy secretions represents acute frontal sinusitis. RIGHT frontal sinus is clear. Minimal layering fluid in the smaller RIGHT division of the sphenoid sinus. Mucosal thickening in the larger sphenoid divisions. Other: None. IMPRESSION: No acute or focal intracranial abnormality. LEFT greater than RIGHT paranasal sinus disease, with both chronic and acute features. The most significant finding is acute LEFT frontal sinusitis, which should be aggressively treated; if no improvement, ENT consultation is warranted. Electronically Signed   By: Elsie Stain M.D.   On: 08/17/2017 20:09    Procedures Procedures   Medications Ordered in ED Medications  metoCLOPramide (REGLAN) injection 10 mg (10 mg Intravenous Given 08/18/17 0143)  diphenhydrAMINE (BENADRYL) injection 25 mg (25 mg Intravenous Given 08/18/17 0144)  ketorolac (TORADOL) 30 MG/ML injection 15 mg (15 mg Intravenous Given 08/18/17 0145)  Ampicillin-Sulbactam (UNASYN) 3 g in sodium chloride 0.9 % 100 mL IVPB (0 g Intravenous Stopped 08/18/17 0255)  ketorolac (TORADOL) 15 MG/ML injection (15 mg  Given 08/18/17 0143)     Initial Impression / Assessment and Plan / ED Course  I have reviewed the triage vital signs and the nursing notes.  Pertinent labs & imaging results that were available during my care of the patient were reviewed by me and considered in my medical decision making (see chart for details). Unable to find patient on narcotic  database    Patient is seen for headache for 6 days.  He has been seen in the urgent care and sent for worst headache of his life.  On my evaluation he reports that headache has been gradually worsening over the past 6 days.  He first described as a "simple" headache No signs of meningitis. I have low [facility] at this time. No signs of pseudotumor on my exam.  No signs of stroke, no signs of head injury.  CT did not reveal any acute intracranial abnormality, but he does have significant maxillary and frontal sinusitis. Patient was started on IV antibiotics.  I feel he is appropriate for discharge as he is much improved, and he has been ambulatory.  No fevers and he is not septic appearing. I feel he is appropriate for outpatient management.  He will be on Augmentin for the next 10 days and he has been instructed to follow-up with ear nose and throat. Patient and I discussed strict ER return precautions Final Clinical Impressions(s) / ED Diagnoses   Final diagnoses:  Other headache syndrome  Acute frontal sinusitis, recurrence not specified    ED Discharge Orders        Ordered    amoxicillin-clavulanate (AUGMENTIN) 875-125 MG tablet  2 times daily     08/18/17 0324    HYDROcodone-acetaminophen (NORCO/VICODIN) 5-325 MG tablet  Every 6 hours PRN     08/18/17 0324       Zadie RhineWickline, Hagop Mccollam, MD 08/18/17 [phone]

## 2017-08-19 ENCOUNTER — Emergency Department (HOSPITAL_COMMUNITY): Payer: 59

## 2017-08-19 ENCOUNTER — Other Ambulatory Visit: Payer: Self-pay

## 2017-08-19 ENCOUNTER — Emergency Department (HOSPITAL_COMMUNITY)
Admission: EM | Admit: 2017-08-19 | Discharge: 2017-08-20 | Disposition: A | Discharge status: Home / Self Care | Payer: 59 | Attending: Emergency Medicine | Admitting: Emergency Medicine

## 2017-08-19 ENCOUNTER — Encounter (HOSPITAL_COMMUNITY): Payer: Self-pay | Admitting: Emergency Medicine

## 2017-08-19 DIAGNOSIS — R51 Headache: Secondary | ICD-10-CM | POA: Diagnosis not present

## 2017-08-19 DIAGNOSIS — F17210 Nicotine dependence, cigarettes, uncomplicated: Secondary | ICD-10-CM | POA: Insufficient documentation

## 2017-08-19 DIAGNOSIS — J0110 Acute frontal sinusitis, unspecified: Secondary | ICD-10-CM

## 2017-08-19 DIAGNOSIS — Z79899 Other long term (current) drug therapy: Secondary | ICD-10-CM | POA: Insufficient documentation

## 2017-08-19 DIAGNOSIS — R519 Headache, unspecified: Secondary | ICD-10-CM

## 2017-08-19 LAB — BASIC METABOLIC PANEL
Anion gap: 10 (ref 5–15)
BUN: 7 mg/dL (ref 6–20)
CALCIUM: 8.2 mg/dL — AB (ref 8.9–10.3)
CO2: 20 mmol/L — ABNORMAL LOW (ref 22–32)
CREATININE: 0.91 mg/dL (ref 0.61–1.24)
Chloride: 107 mmol/L (ref 98–111)
GFR calc Af Amer: >60 mL/min (ref >60)
GFR calc non Af Amer: >60 mL/min (ref >60)
Glucose, Bld: 103 mg/dL — ABNORMAL HIGH (ref 70–99)
Potassium: 3.5 mmol/L (ref 3.5–5.1)
Sodium: 137 mmol/L (ref 135–145)

## 2017-08-19 LAB — CBC WITH DIFFERENTIAL/PLATELET
Abs Immature Granulocytes: 0.1 10*3/uL (ref 0.0–0.1)
Basophils Absolute: 0.1 10*3/uL (ref 0.0–0.1)
Basophils Relative: 1 %
EOS ABS: 0.1 10*3/uL (ref 0.0–0.7)
EOS PCT: 2 %
HCT: 41.9 % (ref 39.0–52.0)
Hemoglobin: 14.1 g/dL (ref 13.0–17.0)
Immature Granulocytes: 1 %
Lymphocytes Relative: 20 %
Lymphs Abs: 1.3 10*3/uL (ref 0.7–4.0)
MCH: 29.2 pg (ref 26.0–34.0)
MCHC: 33.7 g/dL (ref 30.0–36.0)
MCV: 86.7 fL (ref 78.0–100.0)
Monocytes Absolute: 0.3 10*3/uL (ref 0.1–1.0)
Monocytes Relative: 4 %
Neutro Abs: 4.8 10*3/uL (ref 1.7–7.7)
Neutrophils Relative %: 72 %
Platelets: 183 10*3/uL (ref 150–400)
RBC: 4.83 MIL/uL (ref 4.22–5.81)
RDW: 11.5 % (ref 11.5–15.5)
WBC: 6.7 10*3/uL (ref 4.0–10.5)

## 2017-08-19 MED ORDER — KETOROLAC TROMETHAMINE 15 MG/ML IJ SOLN
15.0000 mg | Freq: Once | INTRAMUSCULAR | Status: AC
  Administered 2017-08-19: 15 mg via INTRAVENOUS
  Filled 2017-08-19: qty 1

## 2017-08-19 MED ORDER — GADOBENATE DIMEGLUMINE 529 MG/ML IV SOLN
20.0000 mL | Freq: Once | INTRAVENOUS | Status: AC | PRN
  Administered 2017-08-19: 19 mL via INTRAVENOUS

## 2017-08-19 MED ORDER — METOCLOPRAMIDE HCL 5 MG/ML IJ SOLN
10.0000 mg | Freq: Once | INTRAMUSCULAR | Status: AC
  Administered 2017-08-19: 10 mg via INTRAVENOUS
  Filled 2017-08-19: qty 2

## 2017-08-19 MED ORDER — SODIUM CHLORIDE 0.9 % IV BOLUS
500.0000 mL | Freq: Once | INTRAVENOUS | Status: AC
  Administered 2017-08-19: 500 mL via INTRAVENOUS

## 2017-08-19 MED ORDER — DIPHENHYDRAMINE HCL 50 MG/ML IJ SOLN
25.0000 mg | Freq: Once | INTRAMUSCULAR | Status: AC
  Administered 2017-08-19: 25 mg via INTRAVENOUS
  Filled 2017-08-19: qty 1

## 2017-08-19 MED ORDER — SODIUM CHLORIDE 0.9 % IV BOLUS
1000.0000 mL | Freq: Once | INTRAVENOUS | Status: AC
  Administered 2017-08-19: 1000 mL via INTRAVENOUS

## 2017-08-19 MED ORDER — DEXAMETHASONE SODIUM PHOSPHATE 10 MG/ML IJ SOLN
10.0000 mg | Freq: Once | INTRAMUSCULAR | Status: AC
  Administered 2017-08-19: 10 mg via INTRAVENOUS
  Filled 2017-08-19: qty 1

## 2017-08-19 NOTE — ED Notes (Signed)
Pt continues to be in MRI  

## 2017-08-19 NOTE — ED Provider Notes (Signed)
  Physical Exam  BP 134/80 (BP Location: Right Arm)   Pulse 68   Temp 97.9 F (36.6 C) (Oral)   Resp 18   Ht 5\' 10"  (1.778 m)   Wt 95.3 kg (210 lb)   SpO2 92%   BMI 30.13 kg/m   Physical Exam  Constitutional: Vital signs are normal. He appears well-developed and well-nourished. He is cooperative. He does not have a sickly appearance. No distress.  HENT:  Head: Normocephalic and atraumatic.  Eyes: Pupils are equal, round, and reactive to light. Conjunctivae, EOM and lids are normal.  Eye pain when pupillary reaction tested.  Neck: Normal range of motion and full passive range of motion without pain. Neck supple.  Neurological: He is alert. He has normal strength. No cranial nerve deficit or sensory deficit. He exhibits normal muscle tone. He displays a negative Romberg sign. Coordination normal.  Psychiatric: Thought content normal. Cognition and memory are normal.  Nursing note and vitals reviewed.   ED Course/Procedures   Clinical Course as of Aug 20 4  Wynelle LinkSun Aug 19, 2017  2222 Case discussed with Courtni Couture, PA-C at shift change. ENT consulted earlier and advised to order MRI. Recommendation is to consult neuro after MRI resulted and defer decisions .   If being discharged, send home with Afrin per ENT and have outpatient follow-up scheduled.   [GM]  2309 MRI resulted and showed extensive sinus disease. No intracranial abnormalities seen. Case reviewed with Dr. Melene Planan Floyd. Will consult neuro for their input on case per previous ENT request.  Patient re-evaluated. He states that headache has reduced from 10/10 to 5/10 and has resolution in paresthesias in his left hand.    [GM]  2327 Case discussed with neurologist, Dr. Wilford CornerArora. MRI was reviewed and from neuro standpoint. Pt is cleared. Will discharge patient as previously planned and discussed earlier.   [GM]    Clinical Course User Index [GM] Jatara Huettner, Sharyon MedicusGabrielle I, PA-C   MDM  1. Acute Sinusitis. Continue Augmentin as  previously prescribed on 08/17/17. Prescribed Afrin as well per ENT recommendation. Referral to ENT ASAP.  Dispo: Home. After thorough clinical evaluation, this patient is determined to be medically stable and can be safely discharged with the previously mentioned treatment and/or outpatient follow-up/referral(s). At this time, there are no other apparent medical conditions that require further screening, evaluation or treatment.       Dagoberto LigasMortis, Ryanna Teschner I, PA-C 08/20/17 0006    Melene PlanFloyd, Dan, DO 08/21/17 1911

## 2017-08-19 NOTE — ED Notes (Signed)
PA at bedside.

## 2017-08-19 NOTE — ED Provider Notes (Cosign Needed)
[facility] EMERGENCY [facility]PARTMENT Provider Note   CSN: [id number] Arrival date & time: 08/19/17  1454     History   Chief Complaint Chief Complaint  Patient presents with  . Migraine    HPI Phillip Mcdonald is a 27 y.o. male.  HPI   Patient is a 27 year old male with a history of appendectomy and obesity presents emergency department today for evaluation of a left sided headache that has been present for the last week.  Pt states that he lost vision in his left eye 2 days ago for about 30 seconds to 1 min. States that vision returned however, his left eye is painful and is now sensitive to light and somewhat blurry. Left eye has been tearing.   He states the headache is 9.5/10. Feels like a stabbing pain located on the left side of his head. Feels like pain has worsened since he was seen in the ED 2 days ago.   Denies stiffness in the neck, neck pain, or documented fevers. Denies numbness/weakness to arms or legs. Reports nasal congestion, but denies rhinorrhea, sore throat, or ear pain. Reports nausea, but no vomiting, abd pain.   Has been taking augmentin and hydrocodone and these medications have not improved his sxs.   Past Medical History:  Diagnosis Date  . Appendicitis 05/2016  . Obesity     Patient Active Problem List   Diagnosis Date Noted  . Acute appendicitis 05/25/2016    Past Surgical History:  Procedure Laterality Date  . APPENDECTOMY    . LAPAROSCOPIC APPENDECTOMY N/A 05/25/2016   Procedure: APPENDECTOMY LAPAROSCOPIC;  Surgeon: Griselda Mineroth, Paul III, MD;  [facility];  Service: General;  Laterality: N/A;  . WISDOM TOOTH EXTRACTION          Home Medications    Prior to Admission medications   Medication Sig Start Date End Date Taking? Authorizing Provider  amoxicillin-clavulanate (AUGMENTIN) 875-125 MG tablet Take 1 tablet by mouth 2 (two) times daily. One po bid x 10 days 08/18/17   Zadie RhineWickline, Donald, MD  HYDROcodone-acetaminophen  (NORCO/VICODIN) 5-325 MG tablet Take 1 tablet by mouth every 6 (six) hours as needed for severe pain. 08/18/17   Zadie RhineWickline, Donald, MD  omeprazole (PRILOSEC) 40 MG capsule Take 1 capsule (40 mg total) by mouth daily. Patient not taking: Reported on 08/06/2017 05/15/17   McDonald, Pedro EarlsMia A, PA-C  predniSONE (DELTASONE) 20 MG tablet Take 2 tablets (40 mg total) by mouth daily. 08/06/17   Rise MuLeaphart, Kenneth T, PA-C  traMADol (ULTRAM) 50 MG tablet Take 1 tablet (50 mg total) by mouth every 6 (six) hours as needed. Patient not taking: Reported on 02/28/2017 12/12/16   Dietrich PatesKhatri, Hina, PA-C    Family History No family history on file.  Social History Social History   Tobacco Use  . Smoking status: Current Every Day Smoker    Packs/day: 0.50    Years: 4.00    Pack years: 2.00    Types: Cigarettes  . Smokeless tobacco: Never Used  Substance Use Topics  . Alcohol use: No  . Drug use: No     Allergies   Morphine and related; Shrimp [shellfish allergy]; and Nutrasweet aspartame [aspartame]   Review of Systems Review of Systems  Constitutional: Negative for fever.  Eyes: Positive for pain.  Neurological: Positive for headaches. Negative for dizziness and light-headedness.     Physical Exam Updated Vital Signs BP 123/69   Pulse 74   Temp 97.9 F (36.6 C) (Oral)   Resp  16   Ht 5\' 10"  (1.778 m)   Wt 95.3 kg (210 lb)   SpO2 98%   BMI 30.13 kg/m   Physical Exam  Constitutional: He is oriented to person, place, and time. He appears well-developed and well-nourished.  HENT:  Head: Normocephalic and atraumatic.  Right Ear: External ear normal.  Left Ear: External ear normal.  Mouth/Throat: Oropharynx is clear and moist.  Right TM serum and obstructed.  Left TM partially obstructed by cerumen as well.  No swelling to the bilateral nasal turbinates.  No pharyngeal erythema or tonsillar swelling.  No tonsillar exudates.  Uvula midline.  Tenderness to the left maxillary sinus.  Eyes: Pupils are  equal, round, and reactive to light. Conjunctivae and EOM are normal.  Photophobia to the left eye.  No consensual photophobia.  No erythema to the eye.  No tearing noted on exam.  Extra ocular movements are intact.  Patient reports pain when moving his left eye to the left however has smooth movements and is able to do so.  No entrapment noted.  No surrounding erythema or swelling.   Neck: Normal range of motion. Neck supple.  No nuchal rigidity.  Full range of motion of the neck without pain.  Cardiovascular: Normal rate, regular rhythm, normal heart sounds and intact distal pulses.  No murmur heard. Pulmonary/Chest: Effort normal and breath sounds normal. No respiratory distress. He has no wheezes.  Abdominal: Soft. Bowel sounds are normal. There is no tenderness.  Musculoskeletal: He exhibits no edema.  Neurological: He is alert and oriented to person, place, and time.  Mental Status:  Alert, thought content appropriate, able to give a coherent history. Speech fluent without evidence of aphasia. Able to follow 2 step commands without difficulty.  Cranial Nerves:  II:  pupils equal, round, reactive to light III,IV, VI: ptosis not present, extra-ocular motions intact bilaterally  V,VII: smile symmetric, facial light touch sensation equal VIII: hearing grossly normal to voice  X: uvula elevates symmetrically  XI: bilateral shoulder shrug symmetric and strong XII: midline tongue extension without fassiculations Motor:  Normal tone. 5/5 strength of BUE and BLE major muscle groups including strong and equal grip strength and dorsiflexion/plantar flexion Sensory: reports abnormal sensation to the left face, LUE, and LLE DTRs: biceps and achilles 2+ symmetric b/l Cerebellar: normal finger-to-nose with bilateral upper extremities, normal heel to shin CV: 2+ radial and DP/PT pulses No pronator drift  Skin: Skin is warm and dry.  Psychiatric: He has a normal mood and affect.  Nursing note and  vitals reviewed.  ED Treatments / Results  Labs (all labs ordered are listed, but only abnormal results are displayed) Labs Reviewed  BASIC METABOLIC PANEL - Abnormal; Notable for the following components:      Result Value   CO2 20 (*)    Glucose, Bld 103 (*)    Calcium 8.2 (*)    All other components within normal limits  CBC WITH DIFFERENTIAL/PLATELET    EKG None  Radiology Mr Laqueta Jean And Wo Contrast  Result Date: 08/19/2017 CLINICAL DATA:  27 y/o M; headache around the left eye. Sinus infection. Sensitivity to light and sound. EXAM: MRI HEAD WITHOUT AND WITH CONTRAST TECHNIQUE: Multiplanar, multiecho pulse sequences of the brain and surrounding structures were obtained without and with intravenous contrast. CONTRAST:  19mL MULTIHANCE GADOBENATE DIMEGLUMINE 529 MG/ML IV SOLN COMPARISON:  08/17/2017 CT head FINDINGS: Brain: Susceptibility artifact from dental hardware obscures the anterior portion of the brain on DWI and SWI  sequence. No reduced diffusion in the posterior brain suggest acute or early subacute infarction and no abnormal susceptibility hypointensity in the posterior brain to indicate hemorrhage. Morphologically normal pituitary gland, complete corpus callosum, and complete vermis. No structural abnormality of the brain, hydrocephalus, extra-axial collection, tonsillar herniation, effacement of basilar cisterns, or findings of hemorrhage. No white matter lesion or focal mass effect of brain parenchyma. After administration of intravenous contrast there is NO abnormal enhancement identified. Vascular: Normal flow voids. Skull and upper cervical spine: Normal marrow signal. Sinuses/Orbits: Extensive paranasal sinus disease greatest in the left frontal, ethmoid, and maxillary sinuses with fluid levels compatible with acute sinusitis. The anterior inferior sinuses as well as orbits are partially obscured on multiple sequences. Other: None. IMPRESSION: 1. Extensive paranasal sinus  disease greatest in left frontal, ethmoid, and maxillary sinuses with fluid levels compatible with acute sinusitis. 2. Artifact from dental hardware partially obscures the anterior brain and face on multiple sequences. 3. Normal appearance of the visualized brain. No abnormal enhancement. Electronically Signed   By: Mitzi Hansen M.D.   On: 08/19/2017 23:04    Procedures Procedures (including critical care time)  Medications Ordered in ED Medications  diphenhydrAMINE (BENADRYL) injection 25 mg (25 mg Intravenous Given 08/19/17 1707)  metoCLOPramide (REGLAN) injection 10 mg (10 mg Intravenous Given 08/19/17 1716)  sodium chloride 0.9 % bolus 1,000 mL (0 mLs Intravenous Stopped 08/19/17 1926)  dexamethasone (DECADRON) injection 10 mg (10 mg Intravenous Given 08/19/17 1711)  ketorolac (TORADOL) 15 MG/ML injection 15 mg (15 mg Intravenous Given 08/19/17 1810)  sodium chloride 0.9 % bolus 500 mL (0 mLs Intravenous Stopped 08/19/17 2143)  gadobenate dimeglumine (MULTIHANCE) injection 20 mL (19 mLs Intravenous Contrast Given 08/19/17 2229)     Initial Impression / Assessment and Plan / ED Course  I have reviewed the triage vital signs and the nursing notes.  Pertinent labs & imaging results that were available during my care of the patient were reviewed by me and considered in my medical decision making (see chart for details).  Clinical Course as of Aug 19 2308  Wynelle Link Aug 19, 2017  2222 Case discussed with Courtni Dalyn Becker, PA-C at shift change. ENT consulted earlier and advised to order MRI. Recommendation is to consult neuro after MRI resulted and defer decisions .   If being discharged, send home with Afrin per ENT and have outpatient follow-up scheduled.   [GM]  2309 MRI resulted and showed extensive sinus disease.   [GM]    Clinical Course User Index [GM] Mortis, Sharyon Medicus, PA-C  Reevaluated patient after administration of Benadryl, fluids, Reglan and Decadron.  He is sleeping  comfortably in bed.  He states that he is still having 8/10 headache.  Evaluated patient again after administration of Toradol.  discussed case with dr Donnald Garre who reviewed workup and results of prior ct scan. She recommends consulting ENT given pt is still having sxs despite outpt therapy with Augmentin and norco.   8:32 PM CONSULT with Dr. Lazarus Salines who recommends MRI with and without contrast.  States patient will likely need neuro consult.  He can also follow-up in the office tomorrow with Dr. Lazarus Salines.  He recommended that if patient be discharged he should be given a prescription for Afrin.  Final Clinical Impressions(s) / ED Diagnoses   Final diagnoses:  Nonintractable headache, unspecified chronicity pattern, unspecified headache type   Patient presenting with persistent headache for 1 week with complaints of left eye pain, changes in sensation to the left side of  the face left arm and left leg.  Has no focal weakness on exam.  Does have tenderness to the maxillary and frontal sinuses as well as to the left temporal area.  Extraocular movements are intact.  Eyes not red or tearing.  Pupils are equal round and reactive to light.  There is no nuchal rigidity or decreased range of motion of the neck.  Do not suspect a meningitis.  Patient was given medications including Benadryl, Reglan, Toradol, Decadron in the ED without resolution of his symptoms.  His labs are within normal limits.  He has no leukocytosis.   Given his persistent symptoms and the results of his recent CT scan recommending ENT consult if no resolution of symptoms, ENT was consulted.  ENT recommended MRI with and without contrast of the brain and states that patient will likely need neurology consult.  If neurology clears the patient then patient should follow-up with ENT tomorrow in the office and be given a prescription for Afrin.  Patient care was signed out to [facility] mortis, PA-C at shift change with plan to follow-up on  results of MRI studies, consult neurology.  If neurology clears the patient then he should follow-up with Dr. Lazarus Salines in his office tomorrow morning and be given a prescription for Afrin.  ED Discharge Orders    None       Karrie Meres, [place] 08/19/17 2311

## 2017-08-19 NOTE — ED Notes (Signed)
Pt transported to MRI 

## 2017-08-19 NOTE — ED Triage Notes (Signed)
Pt c/o headache, mainly around the left eye. Diagnosed with sinus infection Friday, states abx and pain meds aren't helping. Sensitivity to light and sound. A&O x 4, no neuro deficits noted.

## 2017-08-19 NOTE — ED Notes (Signed)
Paged ENT/Wolicki

## 2017-08-20 DIAGNOSIS — J019 Acute sinusitis, unspecified: Secondary | ICD-10-CM | POA: Insufficient documentation

## 2017-08-20 MED ORDER — OXYMETAZOLINE HCL 0.05 % NA SOLN: 1.0000 | Freq: Two times a day (BID) | NASAL | 0 refills | Status: DC

## 2017-08-20 NOTE — Discharge Instructions (Addendum)
MRI brain showed extensive sinusitis. There were no abnormalities in your brain seen.  We spoke with Dr. Lazarus SalinesWolicki himself today. Give him a call first thing in the morning to be seen. He also recommended that you take Afrin as well which I have prescribed. Continue the medications that you were prescribed on 08/17/17 until you are seen by Dr. Lazarus SalinesWolicki.  Thank you for allowing us to take care of you today!

## 2017-10-28 ENCOUNTER — Emergency Department (HOSPITAL_COMMUNITY)
Admission: EM | Admit: 2017-10-28 | Discharge: 2017-10-28 | Disposition: A | Discharge status: Home / Self Care | Payer: 59 | Attending: Emergency Medicine | Admitting: Emergency Medicine

## 2017-10-28 ENCOUNTER — Emergency Department (HOSPITAL_COMMUNITY): Payer: 59

## 2017-10-28 ENCOUNTER — Other Ambulatory Visit: Payer: Self-pay

## 2017-10-28 ENCOUNTER — Encounter (HOSPITAL_COMMUNITY): Payer: Self-pay | Admitting: Emergency Medicine

## 2017-10-28 DIAGNOSIS — Z79899 Other long term (current) drug therapy: Secondary | ICD-10-CM | POA: Insufficient documentation

## 2017-10-28 DIAGNOSIS — R51 Headache: Secondary | ICD-10-CM

## 2017-10-28 DIAGNOSIS — F17210 Nicotine dependence, cigarettes, uncomplicated: Secondary | ICD-10-CM | POA: Diagnosis not present

## 2017-10-28 DIAGNOSIS — J0101 Acute recurrent maxillary sinusitis: Secondary | ICD-10-CM | POA: Insufficient documentation

## 2017-10-28 DIAGNOSIS — R519 Headache, unspecified: Secondary | ICD-10-CM

## 2017-10-28 HISTORY — DX: Chronic sinusitis, unspecified: J32.9

## 2017-10-28 LAB — I-STAT CHEM 8, ED
BUN: 9 mg/dL (ref 6–20)
Calcium, Ion: 1.09 mmol/L — ABNORMAL LOW (ref 1.15–1.40)
Chloride: 104 mmol/L (ref 98–111)
Creatinine, Ser: 1.1 mg/dL (ref 0.61–1.24)
Glucose, Bld: 89 mg/dL (ref 70–99)
HEMATOCRIT: 38 % — AB (ref 39.0–52.0)
HEMOGLOBIN: 12.9 g/dL — AB (ref 13.0–17.0)
POTASSIUM: 3.8 mmol/L (ref 3.5–5.1)
SODIUM: 138 mmol/L (ref 135–145)
TCO2: 23 mmol/L (ref 22–32)

## 2017-10-28 MED ORDER — IOHEXOL 300 MG/ML  SOLN
75.0000 mL | Freq: Once | INTRAMUSCULAR | Status: AC | PRN
  Administered 2017-10-28: 75 mL via INTRAVENOUS

## 2017-10-28 MED ORDER — SODIUM CHLORIDE 0.9 % IV BOLUS
1000.0000 mL | Freq: Once | INTRAVENOUS | Status: AC
  Administered 2017-10-28: 1000 mL via INTRAVENOUS

## 2017-10-28 MED ORDER — AMOXICILLIN-POT CLAVULANATE 875-125 MG PO TABS: 1.0000 | ORAL_TABLET | Freq: Two times a day (BID) | ORAL | 0 refills | Status: AC

## 2017-10-28 MED ORDER — PROCHLORPERAZINE EDISYLATE 10 MG/2ML IJ SOLN
10.0000 mg | Freq: Once | INTRAMUSCULAR | Status: AC
  Administered 2017-10-28: 10 mg via INTRAVENOUS
  Filled 2017-10-28: qty 2

## 2017-10-28 MED ORDER — MORPHINE SULFATE (PF) 4 MG/ML IV SOLN
4.0000 mg | Freq: Once | INTRAVENOUS | Status: DC
  Filled 2017-10-28: qty 1

## 2017-10-28 NOTE — ED Notes (Signed)
Patient transported to CT 

## 2017-10-28 NOTE — ED Provider Notes (Signed)
[facility] EMERGENCY DEPARTMENT Provider Note   CSN: [id number] Arrival date & time: 10/28/17  1753     History   Chief Complaint Chief Complaint  Patient presents with  . Headache  . Nausea    HPI Phillip Mcdonald is a 27 y.o. male.  HPI Patient is a 27 year old male presents the emergency department with worsening headache over the past 3 days.  He was recently seen in August with a severe headache and found to have severe acute maxillary sinusitis.  He is back to severe maxillary sinus pain and worsening headache.  He completed a course of antibiotics for sinusitis at that time.  He has had significant improvement in all of his symptoms have resolved but they have since returned.  No documented fevers.  No change in his vision.  His pain is moderate to severe in severity.  No other complaints.  No head trauma.  Denies weakness of the arms or legs.   Past Medical History:  Diagnosis Date  . Appendicitis 05/2016  . Obesity   . Sinusitis     Patient Active Problem List   Diagnosis Date Noted  . Acute appendicitis 05/25/2016    Past Surgical History:  Procedure Laterality Date  . APPENDECTOMY    . LAPAROSCOPIC APPENDECTOMY N/A 05/25/2016   Procedure: APPENDECTOMY LAPAROSCOPIC;  Surgeon: Griselda Miner, MD;  Location: [facility] OR;  Service: General;  Laterality: N/A;  . WISDOM TOOTH EXTRACTION          Home Medications    Prior to Admission medications   Medication Sig Start Date End Date Taking? Authorizing Provider  amoxicillin-clavulanate (AUGMENTIN) 875-125 MG tablet Take 1 tablet by mouth every 12 (twelve) hours for 14 days. 10/28/17 11/11/17  Azalia Bilis, MD  HYDROcodone-acetaminophen (NORCO/VICODIN) 5-325 MG tablet Take 1 tablet by mouth every 6 (six) hours as needed for severe pain. 08/18/17   [name], MD  omeprazole (PRILOSEC) 40 MG capsule Take 1 capsule (40 mg total) by mouth daily. Patient not taking: Reported on 08/06/2017 05/15/17    McDonald, [name], PA-C  oxymetazoline (AFRIN NASAL SPRAY) 0.05 % nasal spray Place 1 spray into both nostrils 2 (two) times daily. 08/19/17   Mortis, [name], PA-C  predniSONE (DELTASONE) 20 MG tablet Take 2 tablets (40 mg total) by mouth daily. 08/06/17   [name], PA-C  traMADol (ULTRAM) 50 MG tablet Take 1 tablet (50 mg total) by mouth every 6 (six) hours as needed. Patient not taking: Reported on 02/28/2017 12/12/16   [name], PA-C    Family History No family history on file.  Social History Social History   Tobacco Use  . Smoking status: Current Some Day Smoker    Packs/day: 0.50    Years: 4.00    Pack years: 2.00    Types: Cigarettes  . Smokeless tobacco: Never Used  Substance Use Topics  . Alcohol use: No  . Drug use: No     Allergies   Morphine and related; Shrimp [shellfish allergy]; and Nutrasweet aspartame [aspartame]   Review of Systems Review of Systems  All other systems reviewed and are negative.    Physical Exam Updated Vital Signs BP 128/75   Pulse 88   Temp 98.9 F (37.2 C) (Oral)   Resp 18   SpO2 99%   Physical Exam  Constitutional: He is oriented to person, place, and time. He appears well-developed and well-nourished.  HENT:  Head: Normocephalic and atraumatic.  Eyes: Pupils are equal,  round, and reactive to light. EOM are normal.  Neck: Normal range of motion.  Cardiovascular: Normal rate, regular rhythm, normal heart sounds and intact distal pulses.  Pulmonary/Chest: Effort normal and breath sounds normal. No respiratory distress.  Abdominal: Soft. He exhibits no distension. There is no tenderness.  Musculoskeletal: Normal range of motion.  Neurological: He is alert and oriented to person, place, and time.  5/5 strength in major muscle groups of  bilateral upper and lower extremities. Speech normal. No facial asymetry.   Skin: Skin is warm and dry.  Psychiatric: He has a normal mood and affect. Judgment normal.  Nursing  note and vitals reviewed.    ED Treatments / Results  Labs (all labs ordered are listed, but only abnormal results are displayed) Labs Reviewed  I-STAT CHEM 8, ED - Abnormal; Notable for the following components:      Result Value   Calcium, Ion 1.09 (*)    Hemoglobin 12.9 (*)    HCT 38.0 (*)    All other components within normal limits    EKG None  Radiology Ct Head W Or Wo Contrast  Result Date: 10/28/2017 CLINICAL DATA:  Headache and pain to LEFT-sided face for 3 days. Slight blurred vision. EXAM: CT HEAD WITHOUT AND WITH CONTRAST TECHNIQUE: Contiguous axial images were obtained from the base of the skull through the vertex without and with intravenous contrast CONTRAST:  75mL OMNIPAQUE IOHEXOL 300 MG/ML  SOLN COMPARISON:  MR brain 08/19/2017.  CT head 08/17/2017. FINDINGS: Brain: No evidence for acute infarction, hemorrhage, mass lesion, hydrocephalus, or extra-axial fluid. Normal cerebral volume. No white matter disease. Post infusion, no abnormal enhancement. Vascular: No hyperdense vessel or unexpected calcification. Visible vessels are patent. Skull: Intact Sinuses/Orbits: Significant opacity with layering fluid in the LEFT maxillary sinus indicating acuity. There is no stranding or inflammation in the retromaxillary fat. BILATERAL ethmoid fluid accumulation, could be acute or chronic. Chronic malar fat. appearing opacity of the RIGHT division sphenoid. Small retention cyst RIGHT maxillary sinus. Frontal sinuses clear. Negative orbits. Other: No mastoid fluid. Compared with prior CT and MR studies, paranasal sinus disease was noted in the LEFT maxillary sinus, but has progressed. IMPRESSION: CT of the head demonstrates no acute or focal intracranial abnormality. No abnormal postcontrast enhancement. Significant fluid accumulation in the LEFT maxillary sinus with layering fluid is indicative of acute maxillary sinusitis. Correlate clinically for a cause of facial pain. Electronically  Signed   By: Elsie Stain M.D.   On: 10/28/2017 21:29    Procedures Procedures (including critical care time)  Medications Ordered in ED Medications  morphine 4 MG/ML injection 4 mg (4 mg Intravenous Not Given 10/28/17 1921)  prochlorperazine (COMPAZINE) injection 10 mg (10 mg Intravenous Given 10/28/17 1921)  sodium chloride 0.9 % bolus 1,000 mL (0 mLs Intravenous Stopped 10/28/17 2105)  iohexol (OMNIPAQUE) 300 MG/ML solution 75 mL (75 mLs Intravenous Contrast Given 10/28/17 2044)     Initial Impression / Assessment and Plan / ED Course  I have reviewed the triage vital signs and the nursing notes.  Pertinent labs & imaging results that were available during my care of the patient were reviewed by me and considered in my medical decision making (see chart for details).     Patient feels better after treatment here in the emergency department.  CT head with and without contrast demonstrates acute maxillary sinus disease without signs of intracranial abscess.  Head CT otherwise normal.  Patient be placed on Augmentin.  I have instructed  the patient to follow back up with his ear nose and throat surgeon.  He may benefit from sinus surgery given the recurrent nature of this.  Final Clinical Impressions(s) / ED Diagnoses   Final diagnoses:  Acute nonintractable headache, unspecified headache type  Acute recurrent maxillary sinusitis    ED Discharge Orders         Ordered    amoxicillin-clavulanate (AUGMENTIN) 875-125 MG tablet  Every 12 hours     10/28/17 2135           Azalia Bilis, MD 10/28/17 2141

## 2017-10-28 NOTE — ED Notes (Signed)
Wasted 4 mg of Morphine with Jerry Caras, RN.

## 2017-10-28 NOTE — ED Triage Notes (Addendum)
C/o headache/pain to L side of face x 3 days.  Reports slight blurred vision to L eye.  C/o nausea and dizziness.  States pain is worse with sudden movement.  History of headache in the past that was diagnosed as sinus infection but states this feels different.  No arm drift.

## 2017-10-28 NOTE — Discharge Instructions (Addendum)
Please call your ENT specialist for follow up

## 2017-12-03 DIAGNOSIS — J324 Chronic pansinusitis: Secondary | ICD-10-CM | POA: Insufficient documentation

## 2017-12-19 DIAGNOSIS — H722X1 Other marginal perforations of tympanic membrane, right ear: Secondary | ICD-10-CM | POA: Insufficient documentation

## 2017-12-19 DIAGNOSIS — H60312 Diffuse otitis externa, left ear: Secondary | ICD-10-CM | POA: Insufficient documentation

## 2018-05-18 ENCOUNTER — Inpatient Hospital Stay (HOSPITAL_COMMUNITY)
Admission: EM | Admit: 2018-05-18 | Discharge: 2018-05-20 | DRG: 958 | Disposition: A | Discharge status: Home / Self Care | Payer: 59 | Attending: General Surgery | Admitting: General Surgery

## 2018-05-18 DIAGNOSIS — S42352B Displaced comminuted fracture of shaft of humerus, left arm, initial encounter for open fracture: Principal | ICD-10-CM | POA: Diagnosis present

## 2018-05-18 DIAGNOSIS — S2242XA Multiple fractures of ribs, left side, initial encounter for closed fracture: Secondary | ICD-10-CM | POA: Diagnosis present

## 2018-05-18 DIAGNOSIS — Y92410 Unspecified street and highway as the place of occurrence of the external cause: Secondary | ICD-10-CM

## 2018-05-18 DIAGNOSIS — Z6839 Body mass index (BMI) 39.0-39.9, adult: Secondary | ICD-10-CM

## 2018-05-18 DIAGNOSIS — S42352A Displaced comminuted fracture of shaft of humerus, left arm, initial encounter for closed fracture: Secondary | ICD-10-CM

## 2018-05-18 DIAGNOSIS — F17200 Nicotine dependence, unspecified, uncomplicated: Secondary | ICD-10-CM | POA: Diagnosis present

## 2018-05-18 DIAGNOSIS — S27321A Contusion of lung, unilateral, initial encounter: Secondary | ICD-10-CM | POA: Diagnosis present

## 2018-05-18 DIAGNOSIS — Z8781 Personal history of (healed) traumatic fracture: Secondary | ICD-10-CM

## 2018-05-18 DIAGNOSIS — S4422XA Injury of radial nerve at upper arm level, left arm, initial encounter: Secondary | ICD-10-CM | POA: Diagnosis present

## 2018-05-18 DIAGNOSIS — Z9889 Other specified postprocedural states: Secondary | ICD-10-CM

## 2018-05-18 DIAGNOSIS — E669 Obesity, unspecified: Secondary | ICD-10-CM | POA: Diagnosis present

## 2018-05-18 DIAGNOSIS — S42309B Unspecified fracture of shaft of humerus, unspecified arm, initial encounter for open fracture: Secondary | ICD-10-CM | POA: Diagnosis present

## 2018-05-18 DIAGNOSIS — S2242XB Multiple fractures of ribs, left side, initial encounter for open fracture: Secondary | ICD-10-CM

## 2018-05-18 DIAGNOSIS — Z20828 Contact with and (suspected) exposure to other viral communicable diseases: Secondary | ICD-10-CM | POA: Diagnosis present

## 2018-05-18 DIAGNOSIS — Z419 Encounter for procedure for purposes other than remedying health state, unspecified: Secondary | ICD-10-CM

## 2018-05-18 HISTORY — DX: Displaced comminuted fracture of shaft of humerus, left arm, initial encounter for open fracture: S42.352B

## 2018-05-18 MED ORDER — FENTANYL CITRATE (PF) 100 MCG/2ML IJ SOLN
INTRAMUSCULAR | Status: AC
  Administered 2018-05-18: 100 ug
  Filled 2018-05-18: qty 2

## 2018-05-19 ENCOUNTER — Encounter (HOSPITAL_COMMUNITY): Payer: Self-pay | Admitting: *Deleted

## 2018-05-19 ENCOUNTER — Encounter (HOSPITAL_COMMUNITY): Admission: EM | Disposition: A | Discharge status: Home / Self Care | Payer: Self-pay | Source: Home / Self Care

## 2018-05-19 ENCOUNTER — Other Ambulatory Visit: Payer: Self-pay

## 2018-05-19 ENCOUNTER — Inpatient Hospital Stay (HOSPITAL_COMMUNITY): Payer: 59 | Admitting: Certified Registered Nurse Anesthetist

## 2018-05-19 ENCOUNTER — Inpatient Hospital Stay (HOSPITAL_COMMUNITY): Payer: 59

## 2018-05-19 ENCOUNTER — Emergency Department (HOSPITAL_COMMUNITY): Payer: 59

## 2018-05-19 DIAGNOSIS — S42309B Unspecified fracture of shaft of humerus, unspecified arm, initial encounter for open fracture: Secondary | ICD-10-CM | POA: Diagnosis present

## 2018-05-19 DIAGNOSIS — S4422XA Injury of radial nerve at upper arm level, left arm, initial encounter: Secondary | ICD-10-CM | POA: Diagnosis present

## 2018-05-19 DIAGNOSIS — S42352B Displaced comminuted fracture of shaft of humerus, left arm, initial encounter for open fracture: Secondary | ICD-10-CM

## 2018-05-19 DIAGNOSIS — Z6839 Body mass index (BMI) 39.0-39.9, adult: Secondary | ICD-10-CM | POA: Diagnosis not present

## 2018-05-19 DIAGNOSIS — F17200 Nicotine dependence, unspecified, uncomplicated: Secondary | ICD-10-CM | POA: Diagnosis present

## 2018-05-19 DIAGNOSIS — Z20828 Contact with and (suspected) exposure to other viral communicable diseases: Secondary | ICD-10-CM | POA: Diagnosis present

## 2018-05-19 DIAGNOSIS — S27321A Contusion of lung, unilateral, initial encounter: Secondary | ICD-10-CM | POA: Diagnosis present

## 2018-05-19 DIAGNOSIS — S2242XA Multiple fractures of ribs, left side, initial encounter for closed fracture: Secondary | ICD-10-CM | POA: Diagnosis present

## 2018-05-19 DIAGNOSIS — S42352A Displaced comminuted fracture of shaft of humerus, left arm, initial encounter for closed fracture: Secondary | ICD-10-CM | POA: Diagnosis present

## 2018-05-19 DIAGNOSIS — E669 Obesity, unspecified: Secondary | ICD-10-CM | POA: Diagnosis present

## 2018-05-19 DIAGNOSIS — Y92410 Unspecified street and highway as the place of occurrence of the external cause: Secondary | ICD-10-CM | POA: Diagnosis not present

## 2018-05-19 HISTORY — DX: Displaced comminuted fracture of shaft of humerus, left arm, initial encounter for open fracture: S42.352B

## 2018-05-19 HISTORY — PX: ORIF HUMERUS FRACTURE: SHX2126

## 2018-05-19 HISTORY — PX: I & D EXTREMITY: SHX5045

## 2018-05-19 LAB — ETHANOL: Alcohol, Ethyl (B): <10 mg/dL (ref <10)

## 2018-05-19 LAB — CBC
HCT: 49.4 % (ref 39.0–52.0)
Hemoglobin: 16.7 g/dL (ref 13.0–17.0)
MCH: 29.7 pg (ref 26.0–34.0)
MCHC: 33.8 g/dL (ref 30.0–36.0)
MCV: 87.7 fL (ref 80.0–100.0)
Platelets: 261 10*3/uL (ref 150–400)
RBC: 5.63 MIL/uL (ref 4.22–5.81)
RDW: 11.8 % (ref 11.5–15.5)
WBC: 15.8 10*3/uL — ABNORMAL HIGH (ref 4.0–10.5)
nRBC: 0 % (ref 0.0–0.2)

## 2018-05-19 LAB — POCT I-STAT EG7
Acid-base deficit: 1 mmol/L (ref 0.0–2.0)
Bicarbonate: 20.7 mmol/L (ref 20.0–28.0)
Calcium, Ion: 0.95 mmol/L — ABNORMAL LOW (ref 1.15–1.40)
HCT: 45 % (ref 39.0–52.0)
Hemoglobin: 15.3 g/dL (ref 13.0–17.0)
O2 Saturation: 100 %
Potassium: 3.8 mmol/L (ref 3.5–5.1)
Sodium: 139 mmol/L (ref 135–145)
TCO2: 21 mmol/L — ABNORMAL LOW (ref 22–32)
pCO2, Ven: 25.9 mmHg — ABNORMAL LOW (ref 44.0–60.0)
pH, Ven: 7.511 — ABNORMAL HIGH (ref 7.250–7.430)
pO2, Ven: 149 mmHg — ABNORMAL HIGH (ref 32.0–45.0)

## 2018-05-19 LAB — COMPREHENSIVE METABOLIC PANEL
ALT: 40 U/L (ref 0–44)
AST: 34 U/L (ref 15–41)
Albumin: 4.5 g/dL (ref 3.5–5.0)
Alkaline Phosphatase: 76 U/L (ref 38–126)
Anion gap: 15 (ref 5–15)
BUN: 11 mg/dL (ref 6–20)
CO2: 19 mmol/L — ABNORMAL LOW (ref 22–32)
Calcium: 9.5 mg/dL (ref 8.9–10.3)
Chloride: 106 mmol/L (ref 98–111)
Creatinine, Ser: 1.15 mg/dL (ref 0.61–1.24)
GFR calc Af Amer: >60 mL/min (ref >60)
GFR calc non Af Amer: >60 mL/min (ref >60)
Glucose, Bld: 111 mg/dL — ABNORMAL HIGH (ref 70–99)
Potassium: 3.8 mmol/L (ref 3.5–5.1)
Sodium: 140 mmol/L (ref 135–145)
Total Bilirubin: 0.7 mg/dL (ref 0.3–1.2)
Total Protein: 7.3 g/dL (ref 6.5–8.1)

## 2018-05-19 LAB — SARS CORONAVIRUS 2 BY RT PCR (HOSPITAL ORDER, PERFORMED IN ~~LOC~~ HOSPITAL LAB): SARS Coronavirus 2: NEGATIVE

## 2018-05-19 LAB — SAMPLE TO BLOOD BANK

## 2018-05-19 LAB — PROTIME-INR
INR: 1 (ref 0.8–1.2)
Prothrombin Time: 13.4 seconds (ref 11.4–15.2)

## 2018-05-19 LAB — I-STAT CREATININE, ED: Creatinine, Ser: 1 mg/dL (ref 0.61–1.24)

## 2018-05-19 LAB — CDS SEROLOGY

## 2018-05-19 SURGERY — OPEN REDUCTION INTERNAL FIXATION (ORIF) PROXIMAL HUMERUS FRACTURE
Anesthesia: General | Laterality: Left

## 2018-05-19 MED ORDER — FENTANYL CITRATE (PF) 250 MCG/5ML IJ SOLN
INTRAMUSCULAR | Status: AC
  Filled 2018-05-19: qty 5

## 2018-05-19 MED ORDER — OXYCODONE HCL 5 MG PO TABS
5.0000 mg | ORAL_TABLET | ORAL | Status: DC | PRN
  Administered 2018-05-19 – 2018-05-20 (×5): 10 mg via ORAL
  Filled 2018-05-19 (×5): qty 2

## 2018-05-19 MED ORDER — MENTHOL 3 MG MT LOZG: 1.0000 | LOZENGE | OROMUCOSAL | Status: DC | PRN

## 2018-05-19 MED ORDER — ROCURONIUM BROMIDE 10 MG/ML (PF) SYRINGE
PREFILLED_SYRINGE | INTRAVENOUS | Status: AC
  Filled 2018-05-19: qty 20

## 2018-05-19 MED ORDER — SUCCINYLCHOLINE CHLORIDE 200 MG/10ML IV SOSY
PREFILLED_SYRINGE | INTRAVENOUS | Status: AC
  Filled 2018-05-19: qty 10

## 2018-05-19 MED ORDER — ACETAMINOPHEN 325 MG PO TABS: 325.0000 mg | ORAL_TABLET | Freq: Four times a day (QID) | ORAL | Status: DC | PRN

## 2018-05-19 MED ORDER — ONDANSETRON HCL 4 MG/2ML IJ SOLN
INTRAMUSCULAR | Status: DC | PRN
  Administered 2018-05-19: 4 mg via INTRAVENOUS

## 2018-05-19 MED ORDER — DOCUSATE SODIUM 100 MG PO CAPS
100.0000 mg | ORAL_CAPSULE | Freq: Two times a day (BID) | ORAL | Status: DC
  Administered 2018-05-19 – 2018-05-20 (×3): 100 mg via ORAL
  Filled 2018-05-19 (×3): qty 1

## 2018-05-19 MED ORDER — FENTANYL CITRATE (PF) 250 MCG/5ML IJ SOLN
INTRAMUSCULAR | Status: DC | PRN
  Administered 2018-05-19: 50 ug via INTRAVENOUS
  Administered 2018-05-19: 100 ug via INTRAVENOUS
  Administered 2018-05-19: 50 ug via INTRAVENOUS

## 2018-05-19 MED ORDER — DIPHENHYDRAMINE HCL 12.5 MG/5ML PO ELIX: 12.5000 mg | ORAL_SOLUTION | ORAL | Status: DC | PRN

## 2018-05-19 MED ORDER — SUGAMMADEX SODIUM 200 MG/2ML IV SOLN
INTRAVENOUS | Status: DC | PRN
  Administered 2018-05-19: 300 mg via INTRAVENOUS

## 2018-05-19 MED ORDER — ENOXAPARIN SODIUM 40 MG/0.4ML ~~LOC~~ SOLN: 40.0000 mg | SUBCUTANEOUS | Status: DC

## 2018-05-19 MED ORDER — METHOCARBAMOL 500 MG PO TABS
500.0000 mg | ORAL_TABLET | Freq: Four times a day (QID) | ORAL | Status: DC | PRN
  Administered 2018-05-19 – 2018-05-20 (×4): 500 mg via ORAL
  Filled 2018-05-19 (×4): qty 1

## 2018-05-19 MED ORDER — METHOCARBAMOL 500 MG PO TABS
500.0000 mg | ORAL_TABLET | Freq: Four times a day (QID) | ORAL | Status: DC | PRN
  Administered 2018-05-19: 500 mg via ORAL
  Filled 2018-05-19: qty 1

## 2018-05-19 MED ORDER — OXYCODONE HCL 5 MG PO TABS: 10.0000 mg | ORAL_TABLET | ORAL | Status: DC | PRN

## 2018-05-19 MED ORDER — ONDANSETRON HCL 4 MG/2ML IJ SOLN: 4.0000 mg | Freq: Four times a day (QID) | INTRAMUSCULAR | Status: DC | PRN

## 2018-05-19 MED ORDER — BISACODYL 10 MG RE SUPP: 10.0000 mg | Freq: Every day | RECTAL | Status: DC | PRN

## 2018-05-19 MED ORDER — METOCLOPRAMIDE HCL 5 MG PO TABS: 5.0000 mg | ORAL_TABLET | Freq: Three times a day (TID) | ORAL | Status: DC | PRN

## 2018-05-19 MED ORDER — DEXTROSE 5 % IV SOLN
3.0000 g | Freq: Three times a day (TID) | INTRAVENOUS | Status: DC
  Administered 2018-05-19: 08:00:00 3 g via INTRAVENOUS
  Filled 2018-05-19 (×6): qty 3000

## 2018-05-19 MED ORDER — LIDOCAINE 2% (20 MG/ML) 5 ML SYRINGE
INTRAMUSCULAR | Status: AC
  Filled 2018-05-19: qty 5

## 2018-05-19 MED ORDER — LACTATED RINGERS IV SOLN
INTRAVENOUS | Status: DC | PRN
  Administered 2018-05-19 (×2): via INTRAVENOUS

## 2018-05-19 MED ORDER — POTASSIUM CHLORIDE IN NACL 20-0.9 MEQ/L-% IV SOLN
INTRAVENOUS | Status: DC
  Administered 2018-05-19: 03:00:00 via INTRAVENOUS
  Filled 2018-05-19: qty 1000

## 2018-05-19 MED ORDER — ZOLPIDEM TARTRATE 5 MG PO TABS: 5.0000 mg | ORAL_TABLET | Freq: Every evening | ORAL | Status: DC | PRN

## 2018-05-19 MED ORDER — DOCUSATE SODIUM 100 MG PO CAPS
100.0000 mg | ORAL_CAPSULE | Freq: Two times a day (BID) | ORAL | Status: DC
  Administered 2018-05-19: 100 mg via ORAL
  Filled 2018-05-19: qty 1

## 2018-05-19 MED ORDER — MAGNESIUM CITRATE PO SOLN: 1.0000 | Freq: Once | ORAL | Status: DC | PRN

## 2018-05-19 MED ORDER — HYDROMORPHONE HCL 1 MG/ML IJ SOLN: 0.2500 mg | INTRAMUSCULAR | Status: DC | PRN

## 2018-05-19 MED ORDER — METHOCARBAMOL 1000 MG/10ML IJ SOLN
500.0000 mg | Freq: Four times a day (QID) | INTRAVENOUS | Status: DC | PRN
  Filled 2018-05-19: qty 5

## 2018-05-19 MED ORDER — TETANUS-DIPHTH-ACELL PERTUSSIS 5-2.5-18.5 LF-MCG/0.5 IM SUSP
0.5000 mL | Freq: Once | INTRAMUSCULAR | Status: AC
  Administered 2018-05-19: 0.5 mL via INTRAMUSCULAR
  Filled 2018-05-19: qty 0.5

## 2018-05-19 MED ORDER — POLYETHYLENE GLYCOL 3350 17 G PO PACK: 17.0000 g | PACK | Freq: Every day | ORAL | Status: DC | PRN

## 2018-05-19 MED ORDER — ACETAMINOPHEN 325 MG PO TABS: 325.0000 mg | ORAL_TABLET | Freq: Once | ORAL | Status: DC

## 2018-05-19 MED ORDER — HYDROMORPHONE HCL 1 MG/ML IJ SOLN: 1.0000 mg | INTRAMUSCULAR | Status: DC | PRN

## 2018-05-19 MED ORDER — PROPOFOL 10 MG/ML IV BOLUS
INTRAVENOUS | Status: DC | PRN
  Administered 2018-05-19: 170 mg via INTRAVENOUS

## 2018-05-19 MED ORDER — PHENOL 1.4 % MT LIQD: 1.0000 | OROMUCOSAL | Status: DC | PRN

## 2018-05-19 MED ORDER — OXYCODONE HCL 5 MG PO TABS
5.0000 mg | ORAL_TABLET | ORAL | Status: DC | PRN
  Administered 2018-05-19: 10 mg via ORAL
  Filled 2018-05-19: qty 2

## 2018-05-19 MED ORDER — HYDROMORPHONE HCL 1 MG/ML IJ SOLN
INTRAMUSCULAR | Status: AC
  Filled 2018-05-19: qty 1

## 2018-05-19 MED ORDER — ACETAMINOPHEN 10 MG/ML IV SOLN
1000.0000 mg | Freq: Once | INTRAVENOUS | Status: DC | PRN
  Administered 2018-05-19: 12:00:00 1000 mg via INTRAVENOUS

## 2018-05-19 MED ORDER — ALUM & MAG HYDROXIDE-SIMETH 200-200-20 MG/5ML PO SUSP: 30.0000 mL | ORAL | Status: DC | PRN

## 2018-05-19 MED ORDER — ONDANSETRON HCL 4 MG/2ML IJ SOLN
INTRAMUSCULAR | Status: AC
  Filled 2018-05-19: qty 2

## 2018-05-19 MED ORDER — DEXAMETHASONE SODIUM PHOSPHATE 10 MG/ML IJ SOLN
INTRAMUSCULAR | Status: AC
  Filled 2018-05-19: qty 1

## 2018-05-19 MED ORDER — LACTATED RINGERS IV SOLN: INTRAVENOUS | Status: DC

## 2018-05-19 MED ORDER — ONDANSETRON HCL 4 MG PO TABS: 4.0000 mg | ORAL_TABLET | Freq: Four times a day (QID) | ORAL | Status: DC | PRN

## 2018-05-19 MED ORDER — HYDROMORPHONE HCL 1 MG/ML IJ SOLN: 0.5000 mg | INTRAMUSCULAR | Status: DC | PRN

## 2018-05-19 MED ORDER — LIDOCAINE 2% (20 MG/ML) 5 ML SYRINGE
INTRAMUSCULAR | Status: DC | PRN
  Administered 2018-05-19: 40 mg via INTRAVENOUS

## 2018-05-19 MED ORDER — ACETAMINOPHEN 10 MG/ML IV SOLN
INTRAVENOUS | Status: AC
  Administered 2018-05-19: 12:00:00 1000 mg via INTRAVENOUS
  Filled 2018-05-19: qty 100

## 2018-05-19 MED ORDER — SENNOSIDES-DOCUSATE SODIUM 8.6-50 MG PO TABS
1.0000 | ORAL_TABLET | Freq: Every evening | ORAL | Status: DC | PRN
  Filled 2018-05-19: qty 1

## 2018-05-19 MED ORDER — BUPIVACAINE-EPINEPHRINE (PF) 0.25% -1:200000 IJ SOLN
INTRAMUSCULAR | Status: AC
  Filled 2018-05-19: qty 30

## 2018-05-19 MED ORDER — METOCLOPRAMIDE HCL 5 MG/ML IJ SOLN: 5.0000 mg | Freq: Three times a day (TID) | INTRAMUSCULAR | Status: DC | PRN

## 2018-05-19 MED ORDER — SENNA 8.6 MG PO TABS
1.0000 | ORAL_TABLET | Freq: Two times a day (BID) | ORAL | Status: DC
  Administered 2018-05-19: 8.6 mg via ORAL
  Filled 2018-05-19: qty 1

## 2018-05-19 MED ORDER — SODIUM CHLORIDE 0.9 % IR SOLN
Status: DC | PRN
  Administered 2018-05-19 (×3): 3000 mL

## 2018-05-19 MED ORDER — ENOXAPARIN SODIUM 40 MG/0.4ML ~~LOC~~ SOLN
40.0000 mg | SUBCUTANEOUS | Status: DC
  Filled 2018-05-19: qty 0.4

## 2018-05-19 MED ORDER — IOHEXOL 300 MG/ML  SOLN
100.0000 mL | Freq: Once | INTRAMUSCULAR | Status: AC | PRN
  Administered 2018-05-19: 100 mL via INTRAVENOUS

## 2018-05-19 MED ORDER — ACETAMINOPHEN 160 MG/5ML PO SOLN: 325.0000 mg | Freq: Once | ORAL | Status: DC

## 2018-05-19 MED ORDER — DEXAMETHASONE SODIUM PHOSPHATE 10 MG/ML IJ SOLN
INTRAMUSCULAR | Status: DC | PRN
  Administered 2018-05-19: 10 mg via INTRAVENOUS

## 2018-05-19 MED ORDER — SUCCINYLCHOLINE CHLORIDE 200 MG/10ML IV SOSY
PREFILLED_SYRINGE | INTRAVENOUS | Status: DC | PRN
  Administered 2018-05-19: 140 mg via INTRAVENOUS

## 2018-05-19 MED ORDER — ROCURONIUM BROMIDE 10 MG/ML (PF) SYRINGE
PREFILLED_SYRINGE | INTRAVENOUS | Status: DC | PRN
  Administered 2018-05-19: 50 mg via INTRAVENOUS
  Administered 2018-05-19: 30 mg via INTRAVENOUS
  Administered 2018-05-19 (×2): 20 mg via INTRAVENOUS

## 2018-05-19 MED ORDER — ONDANSETRON 4 MG PO TBDP: 4.0000 mg | ORAL_TABLET | Freq: Four times a day (QID) | ORAL | Status: DC | PRN

## 2018-05-19 MED ORDER — SODIUM CHLORIDE 0.9 % IV SOLN
INTRAVENOUS | Status: AC | PRN
  Administered 2018-05-19 (×3): 1000 mL via INTRAVENOUS

## 2018-05-19 MED ORDER — PROMETHAZINE HCL 25 MG/ML IJ SOLN: 6.2500 mg | INTRAMUSCULAR | Status: DC | PRN

## 2018-05-19 MED ORDER — BUPIVACAINE HCL (PF) 0.25 % IJ SOLN
INTRAMUSCULAR | Status: AC
  Filled 2018-05-19: qty 30

## 2018-05-19 MED ORDER — MIDAZOLAM HCL 2 MG/2ML IJ SOLN
INTRAMUSCULAR | Status: DC | PRN
  Administered 2018-05-19: 2 mg via INTRAVENOUS

## 2018-05-19 MED ORDER — PROPOFOL 10 MG/ML IV BOLUS
INTRAVENOUS | Status: AC
  Filled 2018-05-19: qty 20

## 2018-05-19 MED ORDER — CEFAZOLIN SODIUM-DEXTROSE 1-4 GM/50ML-% IV SOLN
1.0000 g | Freq: Four times a day (QID) | INTRAVENOUS | Status: DC
  Administered 2018-05-19 – 2018-05-20 (×4): 1 g via INTRAVENOUS
  Filled 2018-05-19 (×6): qty 50

## 2018-05-19 MED ORDER — MIDAZOLAM HCL 2 MG/2ML IJ SOLN
INTRAMUSCULAR | Status: AC
  Filled 2018-05-19: qty 2

## 2018-05-19 MED ORDER — MEPERIDINE HCL 25 MG/ML IJ SOLN: 6.2500 mg | INTRAMUSCULAR | Status: DC | PRN

## 2018-05-19 MED ORDER — CEFAZOLIN SODIUM-DEXTROSE 1-4 GM/50ML-% IV SOLN
1.0000 g | Freq: Once | INTRAVENOUS | Status: AC
  Administered 2018-05-19: 1 g via INTRAVENOUS
  Filled 2018-05-19: qty 50

## 2018-05-19 MED ORDER — POTASSIUM CHLORIDE IN NACL 20-0.45 MEQ/L-% IV SOLN
INTRAVENOUS | Status: DC
  Administered 2018-05-19: 13:00:00 via INTRAVENOUS
  Filled 2018-05-19 (×2): qty 1000

## 2018-05-19 MED ORDER — GABAPENTIN 300 MG PO CAPS
300.0000 mg | ORAL_CAPSULE | Freq: Three times a day (TID) | ORAL | Status: DC
  Administered 2018-05-19 – 2018-05-20 (×3): 300 mg via ORAL
  Filled 2018-05-19 (×3): qty 1

## 2018-05-19 MED ORDER — HYDROMORPHONE HCL 1 MG/ML IJ SOLN
0.5000 mg | INTRAMUSCULAR | Status: DC | PRN
  Administered 2018-05-19: 1 mg via INTRAVENOUS

## 2018-05-19 MED ORDER — ACETAMINOPHEN 500 MG PO TABS
1000.0000 mg | ORAL_TABLET | Freq: Four times a day (QID) | ORAL | Status: DC
  Administered 2018-05-19 – 2018-05-20 (×3): 1000 mg via ORAL
  Filled 2018-05-19 (×5): qty 2

## 2018-05-19 MED ORDER — ACETAMINOPHEN 500 MG PO TABS
1000.0000 mg | ORAL_TABLET | Freq: Four times a day (QID) | ORAL | Status: DC
  Administered 2018-05-19: 1000 mg via ORAL
  Filled 2018-05-19: qty 2

## 2018-05-19 MED ORDER — FLEET ENEMA 7-19 GM/118ML RE ENEM: 1.0000 | ENEMA | Freq: Once | RECTAL | Status: DC | PRN

## 2018-05-19 SURGICAL SUPPLY — 47 items
BIT DRILL 110X2.5XQCK CNCT (BIT) ×1 IMPLANT
BIT DRILL 2.5 (BIT) ×1
BIT DRILL 2.7 (BIT) ×1
BIT DRILL 2.7MM (BIT) ×1 IMPLANT
BIT DRL 110X2.5XQCK CNCT (BIT) ×1
CLEANER TIP ELECTROSURG 2X2 (MISCELLANEOUS) ×2 IMPLANT
CLSR STERI-STRIP ANTIMIC 1/2X4 (GAUZE/BANDAGES/DRESSINGS) ×2 IMPLANT
COVER SURGICAL LIGHT HANDLE (MISCELLANEOUS) ×2 IMPLANT
COVER WAND RF STERILE (DRAPES) ×2 IMPLANT
DRAPE C-ARM 42X72 X-RAY (DRAPES) ×2 IMPLANT
DRAPE IMP U-DRAPE 54X76 (DRAPES) ×2 IMPLANT
DRAPE INCISE IOBAN 66X45 STRL (DRAPES) ×2 IMPLANT
DRAPE U-SHAPE 47X51 STRL (DRAPES) ×2 IMPLANT
DRILL BIT 2.7MM (BIT) ×1
DRSG MEPILEX BORDER 4X4 (GAUZE/BANDAGES/DRESSINGS) ×2 IMPLANT
DRSG MEPILEX BORDER 4X8 (GAUZE/BANDAGES/DRESSINGS) ×2 IMPLANT
ELECT REM PT RETURN 9FT ADLT (ELECTROSURGICAL) ×2
ELECTRODE REM PT RTRN 9FT ADLT (ELECTROSURGICAL) ×1 IMPLANT
GLOVE BIOGEL PI ORTHO PRO SZ8 (GLOVE) ×2
GLOVE ORTHO TXT STRL SZ7.5 (GLOVE) ×4 IMPLANT
GLOVE PI ORTHO PRO STRL SZ8 (GLOVE) ×2 IMPLANT
GLOVE SURG ORTHO 8.0 STRL STRW (GLOVE) ×4 IMPLANT
GOWN STRL REUS W/ TWL LRG LVL3 (GOWN DISPOSABLE) ×3 IMPLANT
GOWN STRL REUS W/TWL 2XL LVL3 (GOWN DISPOSABLE) ×2 IMPLANT
GOWN STRL REUS W/TWL LRG LVL3 (GOWN DISPOSABLE) ×3
KIT BASIN OR (CUSTOM PROCEDURE TRAY) ×2 IMPLANT
KIT TURNOVER KIT B (KITS) ×2 IMPLANT
MANIFOLD NEPTUNE II (INSTRUMENTS) ×2 IMPLANT
NS IRRIG 1000ML POUR BTL (IV SOLUTION) ×2 IMPLANT
PACK SHOULDER (CUSTOM PROCEDURE TRAY) ×2 IMPLANT
PACK UNIVERSAL I (CUSTOM PROCEDURE TRAY) ×2 IMPLANT
PAD ARMBOARD 7.5X6 YLW CONV (MISCELLANEOUS) ×2 IMPLANT
PLATE COMPRESSION 10-HOLE (Plate) ×2 IMPLANT
PUTTY DBM STAGRAFT PLUS 2CC (Putty) ×2 IMPLANT
SCREW CORTICAL 3.5X26 (Screw) ×12 IMPLANT
SCREW LOCKING 3.5X26MM (Screw) ×4 IMPLANT
SLING ARM FOAM STRAP XLG (SOFTGOODS) ×2 IMPLANT
SPONGE LAP 18X18 RF (DISPOSABLE) ×2 IMPLANT
SUCTION FRAZIER HANDLE 10FR (MISCELLANEOUS) ×2
SUCTION TUBE FRAZIER 10FR DISP (MISCELLANEOUS) ×2 IMPLANT
SUT FIBERWIRE #2 38 T-5 BLUE (SUTURE) ×4
SUTURE FIBERWR #2 38 T-5 BLUE (SUTURE) ×2 IMPLANT
SYR BULB IRRIGATION 50ML (SYRINGE) ×2 IMPLANT
TOWEL OR 17X24 6PK STRL BLUE (TOWEL DISPOSABLE) ×4 IMPLANT
TOWEL OR 17X26 10 PK STRL BLUE (TOWEL DISPOSABLE) ×2 IMPLANT
TUBE CONNECTING 12X1/4 (SUCTIONS) ×2 IMPLANT
WATER STERILE IRR 1000ML POUR (IV SOLUTION) ×2 IMPLANT
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
## 2018-05-19 NOTE — Anesthesia Procedure Notes (Signed)
Procedure Name: Intubation Date/Time: 05/19/2018 7:50 AM Performed by: Alvera Novel, CRNA Pre-anesthesia Checklist: Patient identified, Emergency Drugs available, Suction available and Patient being monitored Patient Re-evaluated:Patient Re-evaluated prior to induction Oxygen Delivery Method: Circle System Utilized Preoxygenation: Pre-oxygenation with 100% oxygen Induction Type: IV induction and Rapid sequence Laryngoscope Size: Glidescope and 4 Grade View: Grade I Tube type: Oral Tube size: 7.0 mm Number of attempts: 1 Airway Equipment and Method: Stylet and Oral airway Placement Confirmation: ETT inserted through vocal cords under direct vision,  positive ETCO2 and breath sounds checked- equal and bilateral Secured at: 22 cm Tube secured with: Tape Dental Injury: Teeth and Oropharynx as per pre-operative assessment
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
## 2018-05-19 NOTE — Anesthesia Preprocedure Evaluation (Addendum)
Anesthesia Evaluation  Patient identified by MRN, date of birth, ID band Patient awake    Reviewed: Allergy & Precautions, NPO status , Patient's Chart, lab work & pertinent test results  Airway Mallampati: III  TM Distance: >3 FB Neck ROM: Full    Dental  (+) Teeth Intact, Dental Advisory Given   Pulmonary Current Smoker,    breath sounds clear to auscultation       Cardiovascular negative cardio ROS   Rhythm:Regular Rate:Normal     Neuro/Psych negative neurological ROS     GI/Hepatic negative GI ROS, Neg liver ROS,   Endo/Other  negative endocrine ROS  Renal/GU negative Renal ROS     Musculoskeletal   Abdominal (+) + obese,   Peds  Hematology negative hematology ROS (+)   Anesthesia Other Findings   Reproductive/Obstetrics                            Anesthesia Physical Anesthesia Plan  ASA: III  Anesthesia Plan: General   Post-op Pain Management: GA combined w/ Regional for post-op pain   Induction: Intravenous  PONV Risk Score and Plan: 2 and Ondansetron, Dexamethasone and Midazolam  Airway Management Planned: Oral ETT  Additional Equipment: None  Intra-op Plan:   Post-operative Plan: Extubation in OR  Informed Consent: I have reviewed the patients History and Physical, chart, labs and discussed the procedure including the risks, benefits and alternatives for the proposed anesthesia with the patient or authorized representative who has indicated his/her understanding and acceptance.     Dental advisory given  Plan Discussed with: CRNA  Anesthesia Plan Comments: (No block due to nerve involvement.)      Anesthesia Quick Evaluation
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
## 2018-05-19 NOTE — ED Notes (Signed)
Transported to CT 

## 2018-05-19 NOTE — ED Notes (Signed)
[employer] at bedside 

## 2018-05-19 NOTE — ED Notes (Signed)
Family updated as to patient's status. Pt wife and brother present in consult room, all belongings except pt phone was given to them.

## 2018-05-19 NOTE — ED Notes (Signed)
Paged ts to Dr Nicanor Alcon

## 2018-05-19 NOTE — Anesthesia Postprocedure Evaluation (Signed)
Anesthesia Post Note  Patient: Phillip Mcdonald  Procedure(s) Performed: OPEN REDUCTION INTERNAL FIXATION (ORIF) LEFT HUMERUS FRACTURE (Left ) Irrigation And Debridement Left Shoulder (Left )     Patient location during evaluation: PACU Anesthesia Type: General Level of consciousness: awake and alert Pain management: pain level controlled Vital Signs Assessment: post-procedure vital signs reviewed and stable Respiratory status: spontaneous breathing, nonlabored ventilation, respiratory function stable and patient connected to nasal cannula oxygen Cardiovascular status: blood pressure returned to baseline and stable Postop Assessment: no apparent nausea or vomiting Anesthetic complications: no    Last Vitals:  Vitals:   05/19/18 1155 05/19/18 1249  BP: 123/78 137/81  Pulse: 85 81  Resp: 19 18  Temp: 36.4 C 36.9 C  SpO2: 95% 99%    Last Pain:  Vitals:   05/19/18 1249  TempSrc: Oral  PainSc:                  Shelton Silvas

## 2018-05-19 NOTE — Discharge Instructions (Signed)
Diet: As you were doing prior to hospitalization   Shower:  May shower but keep the wounds dry, use an occlusive plastic wrap, NO SOAKING IN TUB.  If the bandage gets wet, change with a clean dry gauze.  If you have a splint on, leave the splint in place and keep the splint dry with a plastic bag.  Dressing:  You may change your dressing 3-5 days after surgery, unless you have a splint.  If you have a splint, then just leave the splint in place and we will change your bandages during your first follow-up appointment.    If you had hand or foot surgery, we will plan to remove your stitches in about 2 weeks in the office.  For all other surgeries, there are sticky tapes (steri-strips) on your wounds and all the stitches are absorbable.  Leave the steri-strips in place when changing your dressings, they will peel off with time, usually 2-3 weeks.  Activity:  Increase activity slowly as tolerated, but follow the weight bearing instructions below.  The rules on driving is that you can not be taking narcotics while you drive, and you must feel in control of the vehicle.    Weight Bearing:   Ok for left hand and wrist function, but no lifting with left arm.    To prevent constipation: you may use a stool softener such as -  Colace (over the counter) 100 mg by mouth twice a day  Drink plenty of fluids (prune juice may be helpful) and high fiber foods Miralax (over the counter) for constipation as needed.    Itching:  If you experience itching with your medications, try taking only a single pain pill, or even half a pain pill at a time.  You may take up to 10 pain pills per day, and you can also use benadryl over the counter for itching or also to help with sleep.   Precautions:  If you experience chest pain or shortness of breath - call 911 immediately for transfer to the hospital emergency department!!  If you develop a fever greater that 101 F, purulent drainage from wound, increased redness or  drainage from wound, or calf pain -- Call the office at [phone]                                                Follow- Up Appointment:  Please call for an appointment to be seen in 2 weeks [place] - [phone]
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
## 2018-05-19 NOTE — Progress Notes (Signed)
The patient has been re-examined, and the chart reviewed, and there have been no interval changes to the documented history and physical.    The risks benefits and alternatives were discussed with the patient including but not limited to the risks of nonoperative treatment, versus surgical intervention including infection, bleeding, nerve injury, malunion, nonunion, the need for revision surgery, hardware prominence, hardware failure, the need for hardware removal, blood clots, cardiopulmonary complications, morbidity, mortality, among others, and they were willing to proceed.     Still with radial nerve palsy.  Will monitor stretch injury.  Plan for ORIF.  Eulas Post, MD

## 2018-05-19 NOTE — Transfer of Care (Signed)
Immediate Anesthesia Transfer of Care Note  Patient: Phillip Mcdonald  Procedure(s) Performed: OPEN REDUCTION INTERNAL FIXATION (ORIF) LEFT HUMERUS FRACTURE (Left ) Irrigation And Debridement Left Shoulder (Left )  Patient Location: PACU  Anesthesia Type:General  Level of Consciousness: drowsy  Airway & Oxygen Therapy: Patient Spontanous Breathing and Patient connected to face mask oxygen  Post-op Assessment: Report given to RN and Post -op Vital signs reviewed and stable  Post vital signs: Reviewed and stable  Last Vitals:  Vitals Value Taken Time  BP 153/84 05/19/2018 10:38 AM  Temp    Pulse 103 05/19/2018 10:41 AM  Resp 27 05/19/2018 10:41 AM  SpO2 96 % 05/19/2018 10:41 AM  Vitals shown include unvalidated device data.  Last Pain:  Vitals:   05/19/18 0555  TempSrc: Oral  PainSc:          Complications: No apparent anesthesia complications
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
## 2018-05-20 ENCOUNTER — Encounter (HOSPITAL_COMMUNITY): Payer: Self-pay | Admitting: Orthopedic Surgery

## 2018-05-20 LAB — CBC
HCT: 38.1 % — ABNORMAL LOW (ref 39.0–52.0)
Hemoglobin: 13 g/dL (ref 13.0–17.0)
MCH: 29.5 pg (ref 26.0–34.0)
MCHC: 34.1 g/dL (ref 30.0–36.0)
MCV: 86.4 fL (ref 80.0–100.0)
Platelets: 166 K/uL (ref 150–400)
RBC: 4.41 MIL/uL (ref 4.22–5.81)
RDW: 11.9 % (ref 11.5–15.5)
WBC: 9.7 K/uL (ref 4.0–10.5)
nRBC: 0 % (ref 0.0–0.2)

## 2018-05-20 LAB — BASIC METABOLIC PANEL WITH GFR
Anion gap: 10 (ref 5–15)
BUN: 9 mg/dL (ref 6–20)
CO2: 22 mmol/L (ref 22–32)
Calcium: 8.6 mg/dL — ABNORMAL LOW (ref 8.9–10.3)
Chloride: 107 mmol/L (ref 98–111)
Creatinine, Ser: 0.99 mg/dL (ref 0.61–1.24)
GFR calc Af Amer: >60 mL/min
GFR calc non Af Amer: >60 mL/min
Glucose, Bld: 104 mg/dL — ABNORMAL HIGH (ref 70–99)
Potassium: 3.6 mmol/L (ref 3.5–5.1)
Sodium: 139 mmol/L (ref 135–145)

## 2018-05-20 MED ORDER — OXYCODONE HCL 10 MG PO TABS: 10.0000 mg | ORAL_TABLET | Freq: Four times a day (QID) | ORAL | 0 refills | Status: DC | PRN

## 2018-05-20 MED ORDER — METHOCARBAMOL 500 MG PO TABS: 500.0000 mg | ORAL_TABLET | Freq: Three times a day (TID) | ORAL | 0 refills | Status: DC | PRN

## 2018-05-20 MED ORDER — GABAPENTIN 300 MG PO CAPS: 300.0000 mg | ORAL_CAPSULE | Freq: Three times a day (TID) | ORAL | 0 refills | Status: DC

## 2018-05-20 MED ORDER — ACETAMINOPHEN 500 MG PO TABS: 1000.0000 mg | ORAL_TABLET | Freq: Four times a day (QID) | ORAL | 0 refills | Status: DC
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
## 2018-05-20 NOTE — Progress Notes (Signed)
Phillip Mcdonald to be D/C'd Home per MD order.  Discussed prescriptions and follow up appointments with the patient. Prescriptions given to patient, medication list explained in detail. Pt verbalized understanding.    Vitals:   05/20/18 0057 05/20/18 0543  BP: 136/65 118/74  Pulse: 77 72  Resp: 20 14  Temp: 98.5 F (36.9 C) 98.4 F (36.9 C)  SpO2: 97% 97%    Skin clean, dry and intact without evidence of skin break down, Skin abrasions on bilateral hands, ankles, knees. Compression wrap clean, dry, and intact. Patient informed/educated to wear sling at all times. IV catheter discontinued intact. Site without signs and symptoms of complications. Dressing and pressure applied. Pt denies pain at this time. No complaints noted.  An After Visit Summary was printed and given to the patient. Patient escorted via WC, and D/C home via private auto.  [place] Ineta Sinning RN
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
## 2018-05-21 ENCOUNTER — Encounter (HOSPITAL_COMMUNITY): Payer: Self-pay | Admitting: Emergency Medicine

## 2018-05-24 ENCOUNTER — Encounter (HOSPITAL_COMMUNITY): Payer: Self-pay | Admitting: Orthopedic Surgery

## 2018-08-17 ENCOUNTER — Emergency Department (HOSPITAL_COMMUNITY): Payer: 59

## 2018-08-17 ENCOUNTER — Encounter (HOSPITAL_COMMUNITY): Payer: Self-pay

## 2018-08-17 ENCOUNTER — Emergency Department (HOSPITAL_COMMUNITY)
Admission: EM | Admit: 2018-08-17 | Discharge: 2018-08-17 | Disposition: A | Discharge status: Home / Self Care | Payer: 59 | Attending: Emergency Medicine | Admitting: Emergency Medicine

## 2018-08-17 ENCOUNTER — Other Ambulatory Visit: Payer: Self-pay

## 2018-08-17 DIAGNOSIS — F17210 Nicotine dependence, cigarettes, uncomplicated: Secondary | ICD-10-CM | POA: Diagnosis not present

## 2018-08-17 DIAGNOSIS — S42392K Other fracture of shaft of left humerus, subsequent encounter for fracture with nonunion: Secondary | ICD-10-CM | POA: Insufficient documentation

## 2018-08-17 DIAGNOSIS — Z885 Allergy status to narcotic agent status: Secondary | ICD-10-CM | POA: Insufficient documentation

## 2018-08-17 DIAGNOSIS — S42302K Unspecified fracture of shaft of humerus, left arm, subsequent encounter for fracture with nonunion: Secondary | ICD-10-CM

## 2018-08-17 DIAGNOSIS — Z91013 Allergy to seafood: Secondary | ICD-10-CM | POA: Diagnosis not present

## 2018-08-17 DIAGNOSIS — M79602 Pain in left arm: Secondary | ICD-10-CM | POA: Diagnosis present

## 2018-08-17 MED ORDER — OXYCODONE HCL 5 MG PO TABS
5.0000 mg | ORAL_TABLET | Freq: Once | ORAL | Status: AC
  Administered 2018-08-17: 5 mg via ORAL
  Filled 2018-08-17: qty 1

## 2018-08-17 MED ORDER — ACETAMINOPHEN 500 MG PO TABS
1000.0000 mg | ORAL_TABLET | Freq: Once | ORAL | Status: AC
Start: 2018-08-17 — End: 2018-08-17
  Administered 2018-08-17: 1000 mg via ORAL
  Filled 2018-08-17: qty 2

## 2018-08-17 NOTE — Progress Notes (Signed)
Orthopedic Tech Progress Note Patient Details:  Phillip Mcdonald Dec 24, 1990 [id number]  Ortho Devices Type of Ortho Device: Arm sling, Post (long arm) splint, Stirrup splint Ortho Device/Splint Location: ULE Ortho Device/Splint Interventions: Adjustment, Application, Ordered   Post Interventions Patient Tolerated: Well Instructions Provided: Care of device, Adjustment of device   Janit Pagan 08/17/2018, 12:52 PM

## 2018-08-17 NOTE — ED Notes (Signed)
This RN paged ortho tech for LUE coaptation splint.
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
## 2018-08-17 NOTE — ED Notes (Signed)
Patient verbalizes understanding of discharge instructions. Opportunity for questioning and answers were provided. Armband removed by staff, pt discharged from ED.  

## 2018-08-17 NOTE — Discharge Instructions (Signed)
Please see the information and instructions below regarding your visit.  Your diagnoses today include:  1. 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 Vital signs are listed at the bottom of these instructions.   Medications prescribed:    Take any prescribed medications only as prescribed, and any over the counter medications only as directed on the packaging.  Continue to take the pain medication that was prescribed to you.  Please do not drive, drink alcohol, or operate machinery while taking opiate pain medication.  He can also constipate you so please make sure that she take a stool softener regimen such as MiraLAX if this occurs.  Home care instructions:  Please follow any educational materials contained in this packet.   Follow-up instructions: Please follow-up with Raliegh Ip on Monday.  Return instructions:  Please return to the Emergency Department if you experience worsening symptoms.  Come back to the emergency department for any increasing pain, progressive numbness or tingling of your fingers, or change in color of your fingers. Please return if you have any other emergent concerns.  Additional Information:   Your vital signs today were: BP 132/60    Pulse 65    Temp 98 F (36.7 C) (Oral)    Resp 16    SpO2 97%  If your blood pressure (BP) was elevated on multiple readings during this visit above 130 for the top number or above 80 for the bottom number, please have this repeated by your primary care provider within one month. --------------  Thank you for allowing [place] to participate in your care today.

## 2019-02-21 ENCOUNTER — Other Ambulatory Visit: Payer: Self-pay

## 2019-02-21 ENCOUNTER — Emergency Department (HOSPITAL_COMMUNITY)
Admission: EM | Admit: 2019-02-21 | Discharge: 2019-02-21 | Disposition: A | Discharge status: Home / Self Care | Payer: 59 | Attending: Emergency Medicine | Admitting: Emergency Medicine

## 2019-02-21 ENCOUNTER — Encounter (HOSPITAL_COMMUNITY): Payer: Self-pay | Admitting: *Deleted

## 2019-02-21 ENCOUNTER — Emergency Department (HOSPITAL_COMMUNITY): Payer: 59

## 2019-02-21 DIAGNOSIS — M25532 Pain in left wrist: Secondary | ICD-10-CM | POA: Diagnosis present

## 2019-02-21 DIAGNOSIS — F17200 Nicotine dependence, unspecified, uncomplicated: Secondary | ICD-10-CM | POA: Insufficient documentation

## 2019-02-21 DIAGNOSIS — Z79899 Other long term (current) drug therapy: Secondary | ICD-10-CM | POA: Insufficient documentation

## 2019-02-21 NOTE — ED Triage Notes (Signed)
States he woke up this with pain and swelling in his left wrist and pain radiates up his left arm with pain numbness in his hand.

## 2019-02-21 NOTE — Discharge Instructions (Addendum)
As discussed, your wrist pain and numbness/tingling is most likely related to your nerve damage from your humeral fracture. Call your orthopedic doctor today to schedule an appointment for further evaluation. You may take over-the-counter ibuprofen or Tylenol as needed for pain. You may also continue to wrap your wrist with the Ace wrap as needed for pain. Do not keep it on for more than 24 hours at a time. Follow-up with PCP if symptoms not improved within the next week. Return to the ER for new or worsening symptoms.
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
## 2019-03-02 ENCOUNTER — Other Ambulatory Visit: Payer: Self-pay

## 2019-03-02 ENCOUNTER — Emergency Department (HOSPITAL_COMMUNITY): Payer: 59

## 2019-03-02 ENCOUNTER — Emergency Department (HOSPITAL_COMMUNITY)
Admission: EM | Admit: 2019-03-02 | Discharge: 2019-03-02 | Disposition: A | Discharge status: Home / Self Care | Payer: 59 | Attending: Emergency Medicine | Admitting: Emergency Medicine

## 2019-03-02 ENCOUNTER — Encounter (HOSPITAL_COMMUNITY): Payer: Self-pay | Admitting: Emergency Medicine

## 2019-03-02 DIAGNOSIS — S42352K Displaced comminuted fracture of shaft of humerus, left arm, subsequent encounter for fracture with nonunion: Secondary | ICD-10-CM | POA: Diagnosis not present

## 2019-03-02 DIAGNOSIS — F17200 Nicotine dependence, unspecified, uncomplicated: Secondary | ICD-10-CM | POA: Diagnosis not present

## 2019-03-02 DIAGNOSIS — M79602 Pain in left arm: Secondary | ICD-10-CM

## 2019-03-02 DIAGNOSIS — S4992XD Unspecified injury of left shoulder and upper arm, subsequent encounter: Secondary | ICD-10-CM | POA: Diagnosis present

## 2019-03-02 DIAGNOSIS — Y939 Activity, unspecified: Secondary | ICD-10-CM | POA: Diagnosis not present

## 2019-03-02 DIAGNOSIS — Y999 Unspecified external cause status: Secondary | ICD-10-CM | POA: Diagnosis not present

## 2019-03-02 DIAGNOSIS — X58XXXA Exposure to other specified factors, initial encounter: Secondary | ICD-10-CM | POA: Insufficient documentation

## 2019-03-02 DIAGNOSIS — Z79899 Other long term (current) drug therapy: Secondary | ICD-10-CM | POA: Diagnosis not present

## 2019-03-02 DIAGNOSIS — Y929 Unspecified place or not applicable: Secondary | ICD-10-CM | POA: Insufficient documentation

## 2019-03-02 MED ORDER — OXYCODONE-ACETAMINOPHEN 5-325 MG PO TABS
1.0000 | ORAL_TABLET | Freq: Once | ORAL | Status: AC
  Administered 2019-03-02: 1 via ORAL
  Filled 2019-03-02: qty 1

## 2019-03-02 MED ORDER — HYDROCODONE-ACETAMINOPHEN 5-325 MG PO TABS: 1.0000 | ORAL_TABLET | Freq: Four times a day (QID) | ORAL | 0 refills | Status: DC | PRN

## 2019-03-02 NOTE — ED Notes (Signed)
Called ortho tech again for splint after leaving VM the first time.

## 2019-03-02 NOTE — Discharge Instructions (Signed)
Please call Dr. Shelba Flake office tomorrow to set up follow up appointment Take Ibuprofen 600mg  for pain every 8 hours Take Norco as needed for severe pain

## 2019-03-02 NOTE — Progress Notes (Signed)
Orthopedic Tech Progress Note Patient Details:  Phillip Mcdonald [birth year] [id number]  Ortho Devices Type of Ortho Device: Arm sling, Coapt Ortho Device/Splint Location: lue. Ortho Device/Splint Interventions: Ordered, Application, Adjustment   Post Interventions Patient Tolerated: Well Instructions Provided: Care of device, Adjustment of device   [name] 03/02/2019, 8:47 PM

## 2019-03-02 NOTE — ED Notes (Signed)
Splint completed.  PA saw patient and stated he could leave.  Patient left department.

## 2019-03-02 NOTE — ED Notes (Signed)
Ortho tech at bedside replacing splint
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
## 2019-03-02 NOTE — ED Notes (Signed)
Patient verbalizes understanding of discharge instructions. Opportunity for questioning and answers were provided. Armband removed by staff, pt discharged from ED.  

## 2019-03-02 NOTE — ED Triage Notes (Signed)
C/o L upper arm pain that radiates to L elbow and L shoulder x 2 hours.  States he injured it while pulling a ratchet strap.  States he has hardware in arm from previous injury.

## 2019-03-03 NOTE — Progress Notes (Signed)
Orthopedic Tech Progress Note Patient Details:  Phillip Mcdonald 18-Sep-1990 [id number]  Patient ID: Phillip Mcdonald, male   DOB: [birth year], 29 y.o.   MRN: [id number] The patient returned to the ed with complaint of the splint not fitting right. When I arrived to the room the patient grabbed the splint at the elbow and yanked it down his arm towards his wrist moving it about 1-1 1/2 inches down. Then he said "see it's moving down my arm and doesn't fit right". After explaining to him that if he tries to move it its going to move. Then I replaced the splint because the splint was no longer on correctly. After applying the new splint and making sure it was on right and comfortable he said he still felt like his elbow was going to cause the splint to move down his arm and went to grab the splint to move it again and I stopped him explaining to him again that if he tries to move it it will move. That's when he explained the splint he had on when he had his initial fracture and that that's what he wanted to have put on. He was describing a sugartong splint. He made it clear that he wouldn't feel safe or comfortable unless he got a sugartong splint applied. After all of this happening I went and talked to the doctor and explained to the doctor what all had happened and what had been discussed with the patient. After talking we decided that since it would not cause any harm to the patient and would make the patient more comfortable and secure that it would be ok to apply the sugartong splint. I returned to the patients room and applied the sugartong splint. The patient felt safe and comfortable and happy to have the splint on. The patient then went home.  [name] 03/03/2019, 12:04 AM

## 2019-03-03 NOTE — Progress Notes (Signed)
Orthopedic Tech Progress Note Patient Details:  Phillip Mcdonald 12/14/1990 [id number]  Ortho Devices Type of Ortho Device: Coapt, Sugartong splint Ortho Device/Splint Location: lue. see note in chart. Ortho Device/Splint Interventions: Ordered, Application, Adjustment   Post Interventions Patient Tolerated: Well Instructions Provided: Care of device, Adjustment of device   [name] 03/03/2019, 6:35 AM

## 2019-04-29 ENCOUNTER — Emergency Department (HOSPITAL_COMMUNITY): Payer: 59

## 2019-04-29 ENCOUNTER — Encounter (HOSPITAL_COMMUNITY): Payer: Self-pay | Admitting: Emergency Medicine

## 2019-04-29 ENCOUNTER — Emergency Department (HOSPITAL_COMMUNITY)
Admission: EM | Admit: 2019-04-29 | Discharge: 2019-04-29 | Disposition: A | Discharge status: Home / Self Care | Payer: 59 | Attending: Emergency Medicine | Admitting: Emergency Medicine

## 2019-04-29 DIAGNOSIS — Z6841 Body Mass Index (BMI) 40.0 and over, adult: Secondary | ICD-10-CM | POA: Insufficient documentation

## 2019-04-29 DIAGNOSIS — M25559 Pain in unspecified hip: Secondary | ICD-10-CM

## 2019-04-29 DIAGNOSIS — M25551 Pain in right hip: Secondary | ICD-10-CM | POA: Insufficient documentation

## 2019-04-29 DIAGNOSIS — F17210 Nicotine dependence, cigarettes, uncomplicated: Secondary | ICD-10-CM | POA: Insufficient documentation

## 2019-04-29 DIAGNOSIS — M545 Low back pain, unspecified: Secondary | ICD-10-CM | POA: Insufficient documentation

## 2019-04-29 DIAGNOSIS — Z79899 Other long term (current) drug therapy: Secondary | ICD-10-CM | POA: Diagnosis not present

## 2019-04-29 DIAGNOSIS — G8921 Chronic pain due to trauma: Secondary | ICD-10-CM | POA: Insufficient documentation

## 2019-04-29 DIAGNOSIS — M25552 Pain in left hip: Secondary | ICD-10-CM | POA: Insufficient documentation

## 2019-04-29 DIAGNOSIS — E669 Obesity, unspecified: Secondary | ICD-10-CM | POA: Insufficient documentation

## 2019-04-29 DIAGNOSIS — G8929 Other chronic pain: Secondary | ICD-10-CM

## 2019-04-29 MED ORDER — METHOCARBAMOL 500 MG PO TABS: 500.0000 mg | ORAL_TABLET | Freq: Two times a day (BID) | ORAL | 0 refills | Status: DC

## 2019-04-29 MED ORDER — IBUPROFEN 800 MG PO TABS: 800.0000 mg | ORAL_TABLET | Freq: Three times a day (TID) | ORAL | 0 refills | Status: DC
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
## 2019-04-29 NOTE — Discharge Instructions (Signed)
You were seen in the ER for hip pain and back pain.  Your x-rays did not show any acute signs of fracture, disc bulges.  Your right hip x-ray did show small bone spur in your hip.  I do not think you have any life-threatening causes of low back pain or hip pain.  Please take the prescribed ibuprofen 3 times a day, do not take more than the prescribed dose as this can cause stomach ulcers and kidney problems.  Have also prescribed a muscle relaxer called Robaxin, this medication can make you drowsy, make sure to take at night and do not drink or drive on this medication. Working on core strength and weight loss will help overall long term health and pain.  I have provided some hip and back exercises/stretches to alleviate your pain.  Please follow-up with Dr. Dion Saucier with [facility], as they are the ones on-call in the ER today.  If you are not satisfied with her service, you are welcome to call any other orthopedic provider that is covered by her insurance.  Return to the ER if your symptoms worsen.
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
## 2019-05-01 ENCOUNTER — Other Ambulatory Visit: Payer: Self-pay | Admitting: Sports Medicine

## 2019-05-01 DIAGNOSIS — M25551 Pain in right hip: Secondary | ICD-10-CM

## 2019-05-07 ENCOUNTER — Ambulatory Visit
Admission: RE | Admit: 2019-05-07 | Discharge: 2019-05-07 | Disposition: A | Discharge status: Home / Self Care | Payer: 59 | Source: Ambulatory Visit | Attending: Sports Medicine | Admitting: Sports Medicine

## 2019-05-07 DIAGNOSIS — M25551 Pain in right hip: Secondary | ICD-10-CM

## 2019-05-07 MED ORDER — METHYLPREDNISOLONE ACETATE 40 MG/ML INJ SUSP (RADIOLOG
120.0000 mg | Freq: Once | INTRAMUSCULAR | Status: AC
  Administered 2019-05-07: 40 mg via INTRA_ARTICULAR

## 2019-05-07 MED ORDER — IOPAMIDOL (ISOVUE-M 200) INJECTION 41%
1.0000 mL | Freq: Once | INTRAMUSCULAR | Status: AC
  Administered 2019-05-07: 1 mL via INTRA_ARTICULAR

## 2019-05-07 MED ORDER — DIAZEPAM 5 MG PO TABS
10.0000 mg | ORAL_TABLET | Freq: Once | ORAL | Status: AC
  Administered 2019-05-07: 10 mg via ORAL

## 2019-05-30 IMAGING — CT CT HEAD W/O CM
4 series · 16 of 47 positions shown, 18 images · non-contrast
Comparison: 09/08/2013.

CLINICAL DATA: Migraine for 6 days.  Blurred vision in LEFT eye.

EXAM:
CT HEAD WITHOUT CONTRAST
TECHNIQUE: Contiguous axial images were obtained from the base of the skull
through the vertex without intravenous contrast.

[Series 3: head without · axial · non-contrast · 0.46mm/px · z∈[-98,+32]mm · 7 of 36 slices shown, 9 images]
[im 5/36  brain]
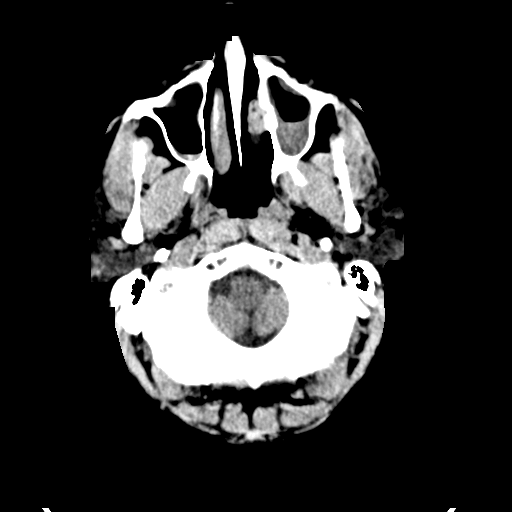
[im 5/36  bone]
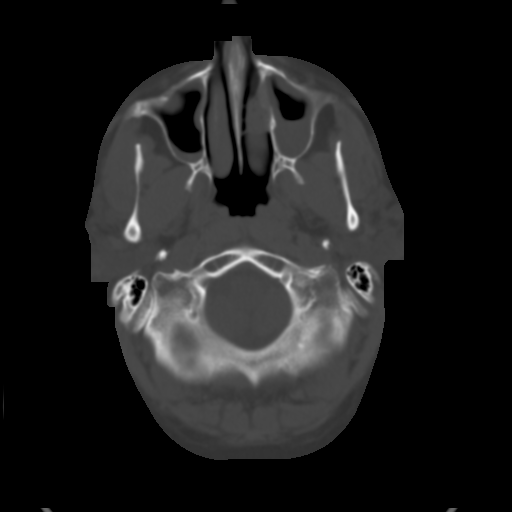
[im 9/36  brain]
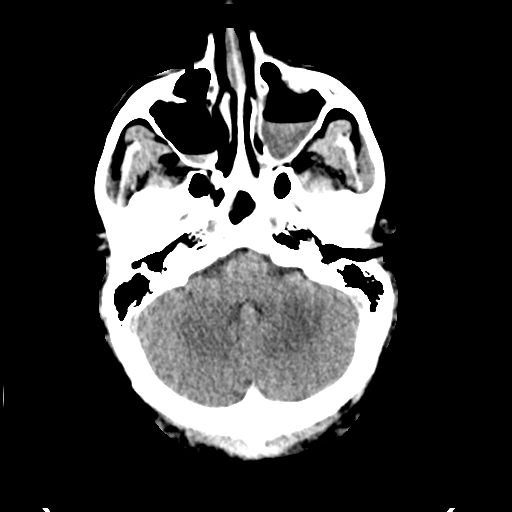
[im 14/36  brain]
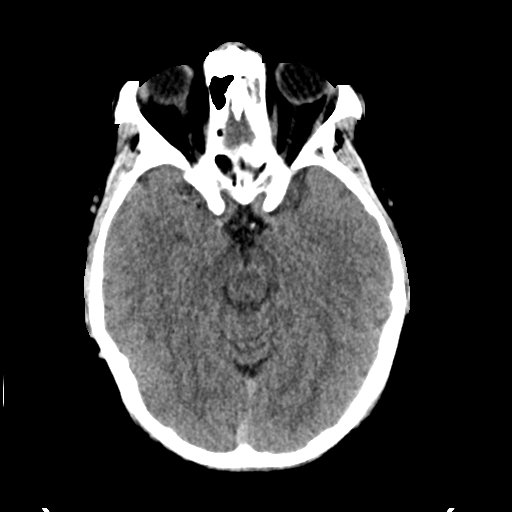
[im 18/36  brain]
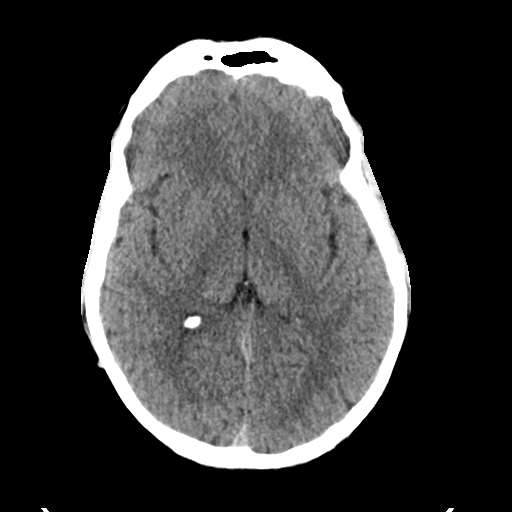
[im 22/36  brain]
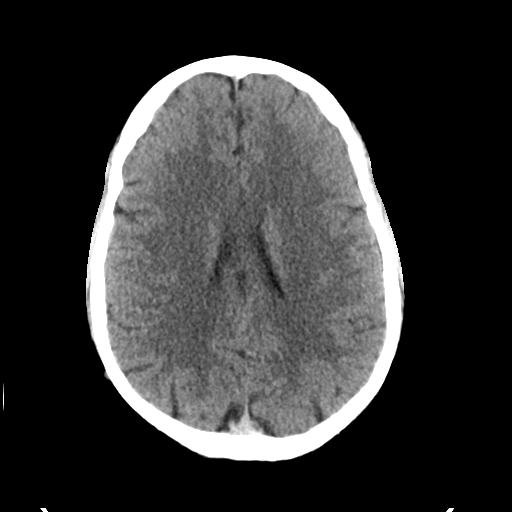
[im 22/36  bone]
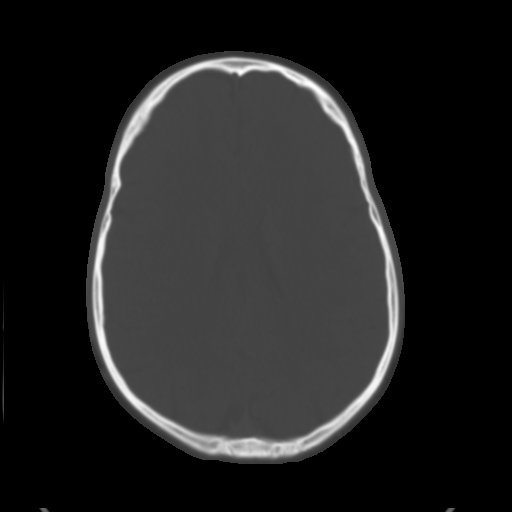
[im 27/36  brain]
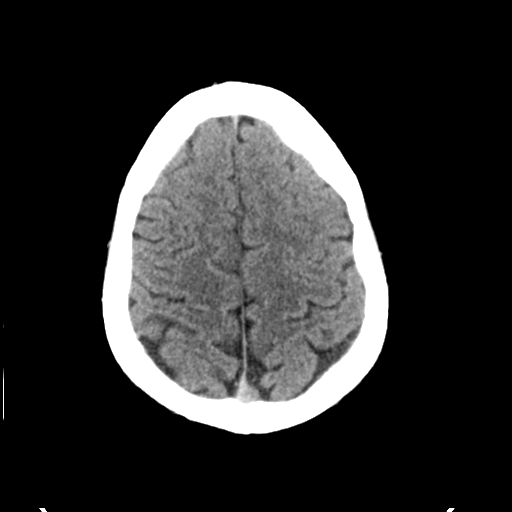
[im 31/36  brain]
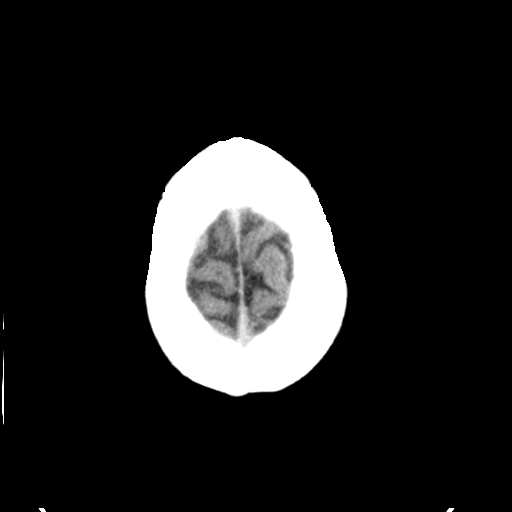

[Series 4: head bone · axial · 0.46mm/px · z∈[-102,-66]mm · 3 of 90 slices shown]
[im 9/90  bone]
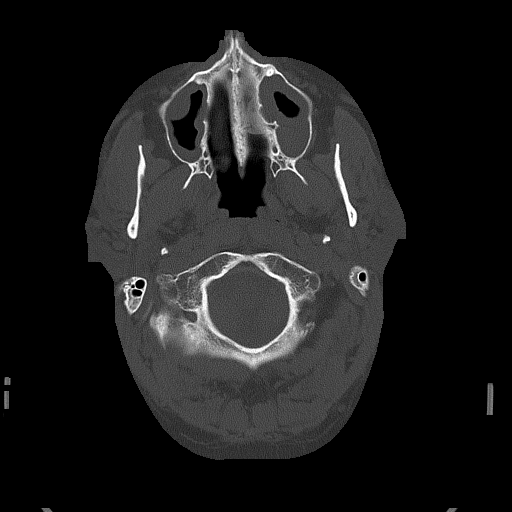
[im 18/90  bone]
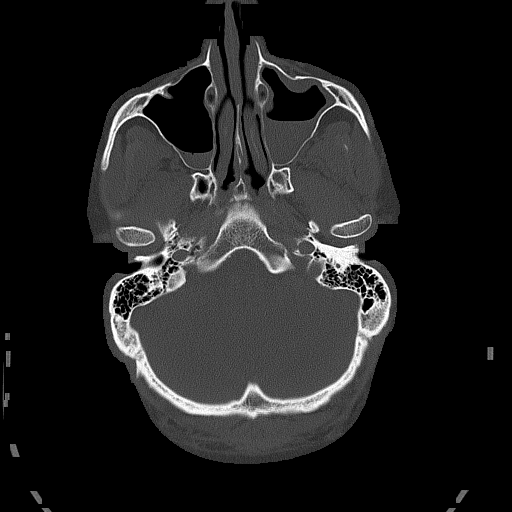
[im 27/90  bone]
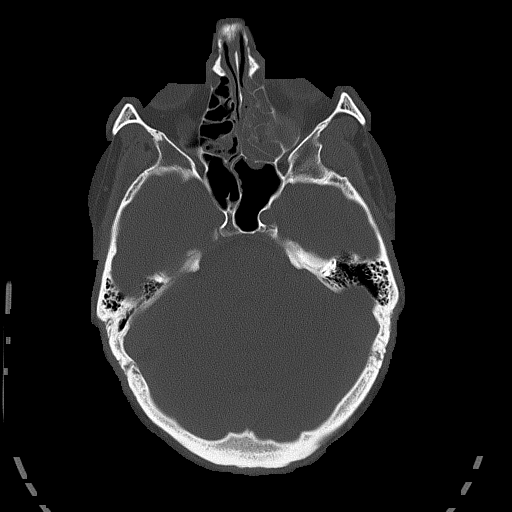

[Series 5: head without cor · coronal · non-contrast · 0.35mm/px · 3 of 75 slices shown]
[im 25/75  brain]
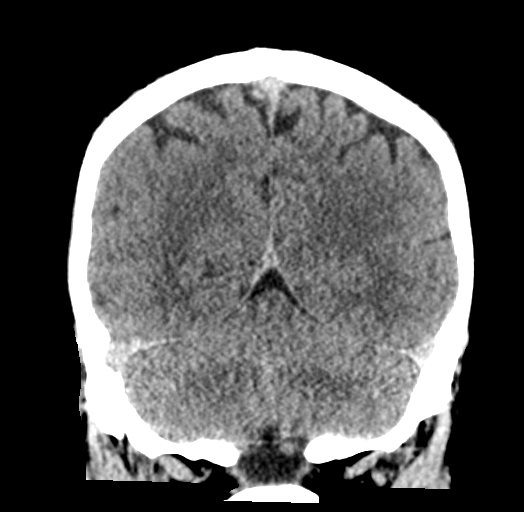
[im 33/75  brain]
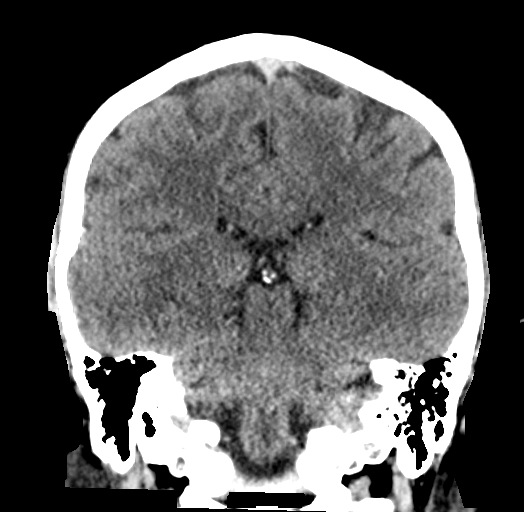
[im 42/75  brain]
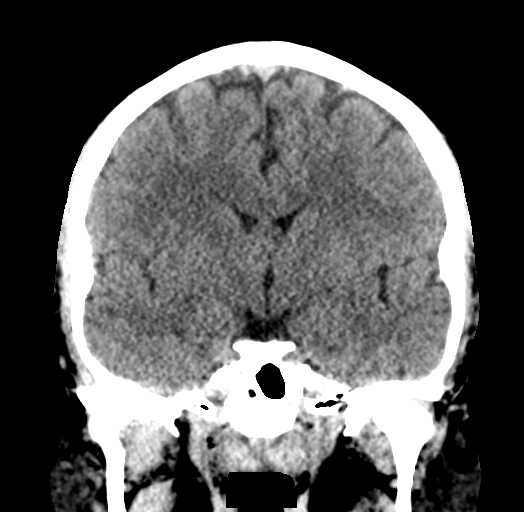

[Series 6: head without sag · sagittal · non-contrast · 0.35mm/px · 3 of 61 slices shown]
[im 21/61  brain]
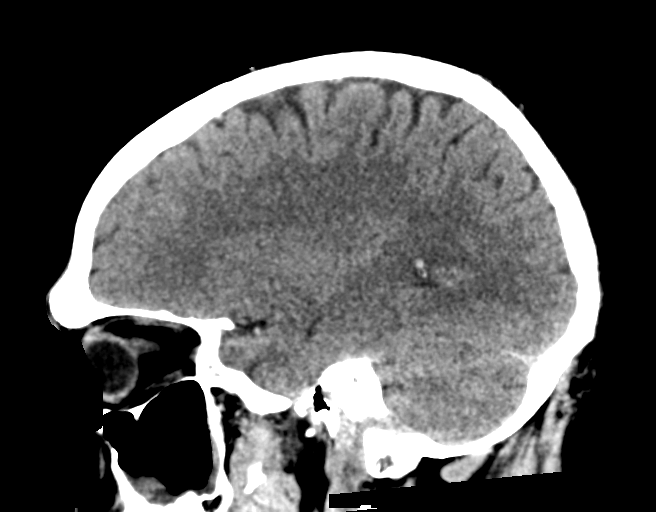
[im 31/61  brain]
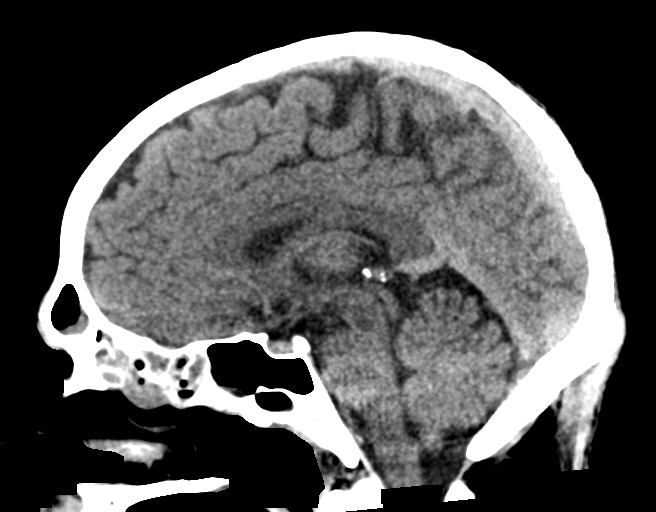
[im 41/61  brain]
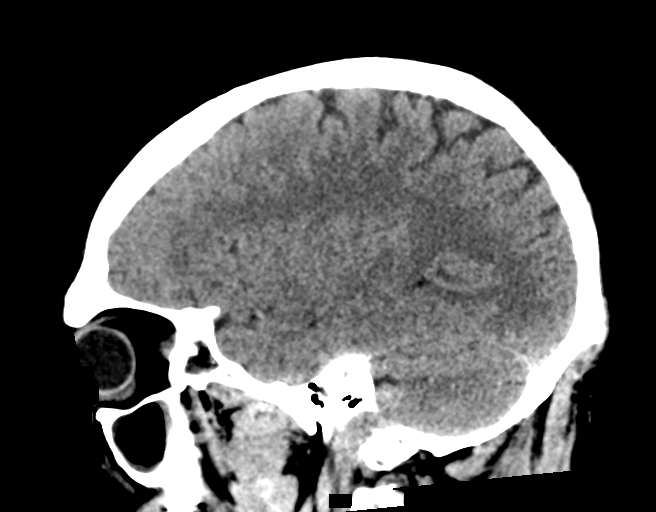

[16 of 47 positions shown; findings below may reference images not displayed]

FINDINGS: Brain: No evidence of acute infarction, hemorrhage, hydrocephalus,
extra-axial collection or mass lesion/mass effect. Normal cerebral
volume. No white matter disease.

Vascular: No hyperdense vessel or unexpected calcification.

Skull: Normal. Negative for fracture or focal lesion.

Sinuses/Orbits: Negative orbits.

In the paranasal sinuses, there is significant opacity. There is
chronic mucosal thickening in the LEFT maxillary sinus, with
superimposed acute layering fluid in both the LEFT (greater) and
RIGHT (smaller) maxillary sinuses. There is significant near
complete opacification of the LEFT anterior and posterior ethmoid
air cells, with less prominent but still significant ethmoid air
cell opacity on the RIGHT.

Layering fluid in LEFT frontal sinus with foamy secretions
represents acute frontal sinusitis. RIGHT frontal sinus is clear.
Minimal layering fluid in the smaller RIGHT division of the sphenoid
sinus. Mucosal thickening in the larger sphenoid divisions.

Other: None.
IMPRESSION: No acute or focal intracranial abnormality.

LEFT greater than RIGHT paranasal sinus disease, with both chronic
and acute features. The most significant finding is acute LEFT
frontal sinusitis, which should be aggressively treated; if no
improvement, ENT consultation is warranted.

## 2019-10-09 ENCOUNTER — Encounter (HOSPITAL_COMMUNITY): Payer: Self-pay | Admitting: Urgent Care

## 2019-10-09 ENCOUNTER — Ambulatory Visit (HOSPITAL_COMMUNITY)
Admission: EM | Admit: 2019-10-09 | Discharge: 2019-10-09 | Disposition: A | Discharge status: Home / Self Care | Payer: 59 | Attending: Urgent Care | Admitting: Urgent Care

## 2019-10-09 ENCOUNTER — Other Ambulatory Visit: Payer: Self-pay

## 2019-10-09 DIAGNOSIS — R0789 Other chest pain: Secondary | ICD-10-CM | POA: Insufficient documentation

## 2019-10-09 DIAGNOSIS — B9789 Other viral agents as the cause of diseases classified elsewhere: Secondary | ICD-10-CM | POA: Insufficient documentation

## 2019-10-09 DIAGNOSIS — J988 Other specified respiratory disorders: Secondary | ICD-10-CM

## 2019-10-09 DIAGNOSIS — R058 Other specified cough: Secondary | ICD-10-CM

## 2019-10-09 DIAGNOSIS — R05 Cough: Secondary | ICD-10-CM | POA: Insufficient documentation

## 2019-10-09 DIAGNOSIS — R059 Cough, unspecified: Secondary | ICD-10-CM

## 2019-10-09 DIAGNOSIS — Z20822 Contact with and (suspected) exposure to covid-19: Secondary | ICD-10-CM | POA: Insufficient documentation

## 2019-10-09 LAB — SARS CORONAVIRUS 2 (TAT 6-24 HRS): SARS Coronavirus 2: NEGATIVE

## 2019-10-09 MED ORDER — PSEUDOEPHEDRINE HCL 60 MG PO TABS: 60.0000 mg | ORAL_TABLET | Freq: Three times a day (TID) | ORAL | 0 refills | Status: DC | PRN

## 2019-10-09 MED ORDER — ALBUTEROL SULFATE HFA 108 (90 BASE) MCG/ACT IN AERS: 1.0000 | INHALATION_SPRAY | Freq: Four times a day (QID) | RESPIRATORY_TRACT | 0 refills | Status: DC | PRN

## 2019-10-09 MED ORDER — BENZONATATE 100 MG PO CAPS: 100.0000 mg | ORAL_CAPSULE | Freq: Three times a day (TID) | ORAL | 0 refills | Status: DC | PRN

## 2019-10-09 MED ORDER — PROMETHAZINE-DM 6.25-15 MG/5ML PO SYRP: 5.0000 mL | ORAL_SOLUTION | Freq: Every evening | ORAL | 0 refills | Status: DC | PRN

## 2019-10-09 MED ORDER — CETIRIZINE HCL 10 MG PO TABS: 10.0000 mg | ORAL_TABLET | Freq: Every day | ORAL | 0 refills | Status: DC

## 2019-10-09 NOTE — ED Triage Notes (Signed)
Patient reports symptoms started yesterday, yesterday evening, symptoms worsened.  Noticed breathing difficulty, sore throat. Patient reports there has been covid cases at work.  Patient has cough and runny nose.

## 2019-10-09 NOTE — Discharge Instructions (Addendum)

## 2019-10-09 NOTE — ED Provider Notes (Signed)
[facility]   MRN: [id number] DOB: [birth year]  Subjective:   Juwann Sherk is a 29 y.o. male presenting for 1 day history of cute onset of chest tightness, cough that elicits chest pain, throat pain, general malaise.  Patient also has sinus congestion.  Had Covid exposure at work.  He is not vaccinated.  Smokes 1 pack/day.  No current facility-administered medications for this encounter.  Current Outpatient Medications:    acetaminophen (TYLENOL) 500 MG tablet, Take 2 tablets (1,000 mg total) by mouth every 6 (six) hours., Disp: 30 tablet, Rfl: 0   gabapentin (NEURONTIN) 300 MG capsule, Take 1 capsule (300 mg total) by mouth 3 (three) times daily., Disp: 20 capsule, Rfl: 0   HYDROcodone-acetaminophen (NORCO/VICODIN) 5-325 MG tablet, Take 1 tablet by mouth every 6 (six) hours as needed., Disp: 10 tablet, Rfl: 0   ibuprofen (ADVIL) 800 MG tablet, Take 1 tablet (800 mg total) by mouth 3 (three) times daily., Disp: 21 tablet, Rfl: 0   methocarbamol (ROBAXIN) 500 MG tablet, Take 1 tablet (500 mg total) by mouth 2 (two) times daily., Disp: 20 tablet, Rfl: 0   oxyCODONE 10 MG TABS, Take 1 tablet (10 mg total) by mouth every 6 (six) hours as needed for severe pain (pain score 7-10)., Disp: 30 tablet, Rfl: 0   oxymetazoline (AFRIN NASAL SPRAY) 0.05 % nasal spray, Place 1 spray into both nostrils 2 (two) times daily., Disp: 30 mL, Rfl: 0   predniSONE (DELTASONE) 20 MG tablet, Take 2 tablets (40 mg total) by mouth daily., Disp: 8 tablet, Rfl: 0   Allergies  Allergen Reactions   Shrimp [Shellfish Allergy] Itching and Swelling   Morphine And Related Itching and Other (See Comments)    "made him feel awful"   Nutrasweet Aspartame [Aspartame] Other (See Comments)    Unknown    Past Medical History:  Diagnosis Date   Appendicitis 05/2016   Displaced comminuted fracture of shaft of humerus, left arm, initial encounter for open fracture, Grade 1 05/19/2018    Obesity    Sinusitis      Past Surgical History:  Procedure Laterality Date   APPENDECTOMY     I & D EXTREMITY Left 05/19/2018   Procedure: Irrigation And Debridement Left Shoulder;  Surgeon: Teryl Lucy, MD;  Location: [facility];  Service: Orthopedics;  Laterality: Left;   LAPAROSCOPIC APPENDECTOMY N/A 05/25/2016   Procedure: APPENDECTOMY LAPAROSCOPIC;  Surgeon: Griselda Miner, MD;  [facility];  Service: General;  Laterality: N/A;   ORIF HUMERUS FRACTURE Left 05/19/2018   Procedure: OPEN REDUCTION INTERNAL FIXATION (ORIF) LEFT HUMERUS FRACTURE;  Surgeon: Teryl Lucy, MD;  Location: [facility] OR;  Service: Orthopedics;  Laterality: Left;   WISDOM TOOTH EXTRACTION      Family History  Problem Relation Age of Onset   Diabetes Mother    Heart attack Father     Social History   Tobacco Use   Smoking status: Current Every Day Smoker   Smokeless tobacco: Never Used  Vaping Use   Vaping Use: Never used  Substance Use Topics   Alcohol use: Yes   Drug use: Never    ROS   Objective:   Vitals: BP 127/87 (BP Location: Right Arm) Comment: large cuff   Pulse 80    Temp 98.2 F (36.8 C) (Oral)    Resp (!) 24    SpO2 100%   Physical Exam Constitutional:      General: He is not in acute distress.  Appearance: Normal appearance. He is well-developed. He is not ill-appearing, toxic-appearing or diaphoretic.  HENT:     Head: Normocephalic and atraumatic.     Right Ear: External ear normal.     Left Ear: External ear normal.     Nose: Nose normal.     Mouth/Throat:     Mouth: Mucous membranes are moist.     Pharynx: Oropharynx is clear. No oropharyngeal exudate or posterior oropharyngeal erythema.  Eyes:     General: No scleral icterus.       Right eye: No discharge.        Left eye: No discharge.     Extraocular Movements: Extraocular movements intact.     Conjunctiva/sclera: Conjunctivae normal.     Pupils: Pupils are equal, round, and reactive to light.   Cardiovascular:     Rate and Rhythm: Normal rate and regular rhythm.     Heart sounds: Normal heart sounds. No murmur heard.  No friction rub. No gallop.   Pulmonary:     Effort: Pulmonary effort is normal. No respiratory distress.     Breath sounds: Normal breath sounds. No stridor. No wheezing, rhonchi or rales.  Skin:    General: Skin is warm and dry.  Neurological:     Mental Status: He is alert and oriented to person, place, and time.  Psychiatric:        Mood and Affect: Mood normal.        Behavior: Behavior normal.        Thought Content: Thought content normal.        Judgment: Judgment normal.     Assessment and Plan :   PDMP not reviewed this encounter.  1. Viral respiratory infection   2. Cough   3. Atypical chest pain   4. Cough with exposure to COVID-19 virus     Will manage for viral illness such as viral URI, viral syndrome, viral rhinitis, high suspicion for COVID-19.  Prescribed an albuterol inhaler for patient in light of his smoking, chest tightness and shortness of breath.  Counseled patient on nature of COVID-19 including modes of transmission, diagnostic testing, management and supportive care.  Offered scripts for symptomatic relief. COVID 19 testing is pending. Counseled patient on potential for adverse effects with medications prescribed/recommended today, ER and return-to-clinic precautions discussed, patient verbalized understanding.    Wallis Bamberg, PA-C 10/09/19 1058

## 2019-12-19 ENCOUNTER — Emergency Department: Payer: No Typology Code available for payment source

## 2019-12-19 ENCOUNTER — Emergency Department
Admission: EM | Admit: 2019-12-19 | Discharge: 2019-12-19 | Disposition: A | Discharge status: Home / Self Care | Payer: No Typology Code available for payment source | Attending: Student in an Organized Health Care Education/Training Program | Admitting: Student in an Organized Health Care Education/Training Program

## 2019-12-19 ENCOUNTER — Encounter: Payer: Self-pay | Admitting: Emergency Medicine

## 2019-12-19 DIAGNOSIS — S6991XA Unspecified injury of right wrist, hand and finger(s), initial encounter: Secondary | ICD-10-CM | POA: Diagnosis present

## 2019-12-19 DIAGNOSIS — S61101A Unspecified open wound of right thumb with damage to nail, initial encounter: Secondary | ICD-10-CM | POA: Insufficient documentation

## 2019-12-19 DIAGNOSIS — F17210 Nicotine dependence, cigarettes, uncomplicated: Secondary | ICD-10-CM | POA: Insufficient documentation

## 2019-12-19 DIAGNOSIS — W268XXA Contact with other sharp object(s), not elsewhere classified, initial encounter: Secondary | ICD-10-CM | POA: Diagnosis not present

## 2019-12-19 DIAGNOSIS — Y990 Civilian activity done for income or pay: Secondary | ICD-10-CM | POA: Diagnosis not present

## 2019-12-19 DIAGNOSIS — S61309A Unspecified open wound of unspecified finger with damage to nail, initial encounter: Secondary | ICD-10-CM

## 2019-12-19 MED ORDER — HYDROCODONE-ACETAMINOPHEN 5-325 MG PO TABS: 1.0000 | ORAL_TABLET | ORAL | 0 refills | Status: AC | PRN

## 2019-12-19 MED ORDER — SULFAMETHOXAZOLE-TRIMETHOPRIM 800-160 MG PO TABS: 1.0000 | ORAL_TABLET | Freq: Two times a day (BID) | ORAL | 0 refills | Status: AC

## 2019-12-19 MED ORDER — LIDOCAINE HCL (PF) 1 % IJ SOLN
10.0000 mL | Freq: Once | INTRAMUSCULAR | Status: DC
  Filled 2019-12-19: qty 10

## 2019-12-19 MED ORDER — HYDROCODONE-ACETAMINOPHEN 5-325 MG PO TABS
1.0000 | ORAL_TABLET | Freq: Once | ORAL | Status: AC
  Administered 2019-12-19: 1 via ORAL
  Filled 2019-12-19: qty 1

## 2019-12-19 MED ORDER — LIDOCAINE HCL (PF) 1 % IJ SOLN
5.0000 mL | Freq: Once | INTRAMUSCULAR | Status: DC
  Filled 2019-12-19: qty 5
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
## 2019-12-19 NOTE — ED Notes (Signed)
Lidocaine given to provider.

## 2019-12-19 NOTE — ED Triage Notes (Signed)
While working at [employer] and [employer], patient states he pushed his right thumb down into a piece of equipment, right thumb nail lifted off of nail bed, attached by small amount of tissue.

## 2019-12-19 NOTE — ED Notes (Signed)
Pt a&ox4 -- splint and dressing dry and intact; pt able to move fingers well,  Denies numbness/tinlging; cap refill <2 seconds.  Pt agreeable with d/c plan as discussed by provider - this nurse has verbally reinforced d/c instructions and provided pt with written copy; pt denies any additional questions concerns needs

## 2020-02-05 ENCOUNTER — Encounter (HOSPITAL_COMMUNITY): Payer: Self-pay | Admitting: Emergency Medicine

## 2020-02-05 ENCOUNTER — Emergency Department (HOSPITAL_COMMUNITY)
Admission: EM | Admit: 2020-02-05 | Discharge: 2020-02-05 | Disposition: A | Discharge status: Home / Self Care | Payer: No Typology Code available for payment source

## 2020-02-05 ENCOUNTER — Other Ambulatory Visit: Payer: Self-pay
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
## 2020-02-05 NOTE — ED Notes (Signed)
Pt left AMA °

## 2020-07-23 ENCOUNTER — Other Ambulatory Visit: Payer: Self-pay

## 2020-07-23 ENCOUNTER — Ambulatory Visit (INDEPENDENT_AMBULATORY_CARE_PROVIDER_SITE_OTHER): Payer: BC Managed Care – PPO

## 2020-07-23 ENCOUNTER — Encounter (HOSPITAL_COMMUNITY): Payer: Self-pay | Admitting: Emergency Medicine

## 2020-07-23 ENCOUNTER — Ambulatory Visit (HOSPITAL_COMMUNITY)
Admission: EM | Admit: 2020-07-23 | Discharge: 2020-07-23 | Disposition: A | Discharge status: Home / Self Care | Payer: BC Managed Care – PPO | Attending: Physician Assistant | Admitting: Physician Assistant

## 2020-07-23 DIAGNOSIS — S43401A Unspecified sprain of right shoulder joint, initial encounter: Secondary | ICD-10-CM | POA: Diagnosis not present

## 2020-07-23 DIAGNOSIS — S46001A Unspecified injury of muscle(s) and tendon(s) of the rotator cuff of right shoulder, initial encounter: Secondary | ICD-10-CM

## 2020-07-23 DIAGNOSIS — M25511 Pain in right shoulder: Secondary | ICD-10-CM | POA: Diagnosis not present

## 2020-07-23 MED ORDER — DICLOFENAC SODIUM 75 MG PO TBEC: 75.0000 mg | DELAYED_RELEASE_TABLET | Freq: Two times a day (BID) | ORAL | 0 refills | Status: DC
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
## 2020-07-23 NOTE — Discharge Instructions (Addendum)
Return if any problems.

## 2020-07-23 NOTE — ED Triage Notes (Signed)
Dog pulled on leash, popped right shoulder one month prior. Pt felt a pop at that time, and has continued to wax and wain with pain and numbness over the last month. 1 week prior, his shoulder popped again. Since then he's had pressure in the shoulder with his 4th and 5th finger with lateral hand numbness.

## 2020-08-17 ENCOUNTER — Emergency Department (HOSPITAL_COMMUNITY): Payer: BC Managed Care – PPO

## 2020-08-17 ENCOUNTER — Emergency Department (HOSPITAL_COMMUNITY)
Admission: EM | Admit: 2020-08-17 | Discharge: 2020-08-17 | Disposition: A | Discharge status: Home / Self Care | Payer: BC Managed Care – PPO | Attending: Student | Admitting: Student

## 2020-08-17 DIAGNOSIS — F17200 Nicotine dependence, unspecified, uncomplicated: Secondary | ICD-10-CM | POA: Insufficient documentation

## 2020-08-17 DIAGNOSIS — R000 Tachycardia, unspecified: Secondary | ICD-10-CM | POA: Diagnosis not present

## 2020-08-17 DIAGNOSIS — D72829 Elevated white blood cell count, unspecified: Secondary | ICD-10-CM | POA: Insufficient documentation

## 2020-08-17 DIAGNOSIS — R059 Cough, unspecified: Secondary | ICD-10-CM | POA: Diagnosis present

## 2020-08-17 DIAGNOSIS — J069 Acute upper respiratory infection, unspecified: Secondary | ICD-10-CM | POA: Insufficient documentation

## 2020-08-17 DIAGNOSIS — Z20822 Contact with and (suspected) exposure to covid-19: Secondary | ICD-10-CM | POA: Diagnosis not present

## 2020-08-17 LAB — CBC WITH DIFFERENTIAL/PLATELET
Abs Immature Granulocytes: 0.36 10*3/uL — ABNORMAL HIGH (ref 0.00–0.07)
Basophils Absolute: 0.1 10*3/uL (ref 0.0–0.1)
Basophils Relative: 0 %
Eosinophils Absolute: 0.3 10*3/uL (ref 0.0–0.5)
Eosinophils Relative: 3 %
HCT: 43.8 % (ref 39.0–52.0)
Hemoglobin: 15.2 g/dL (ref 13.0–17.0)
Immature Granulocytes: 3 %
Lymphocytes Relative: 19 %
Lymphs Abs: 2.1 10*3/uL (ref 0.7–4.0)
MCH: 30 pg (ref 26.0–34.0)
MCHC: 34.7 g/dL (ref 30.0–36.0)
MCV: 86.4 fL (ref 80.0–100.0)
Monocytes Absolute: 0.9 10*3/uL (ref 0.1–1.0)
Monocytes Relative: 8 %
Neutro Abs: 7.7 10*3/uL (ref 1.7–7.7)
Neutrophils Relative %: 67 %
Platelets: 194 10*3/uL (ref 150–400)
RBC: 5.07 MIL/uL (ref 4.22–5.81)
RDW: 11.9 % (ref 11.5–15.5)
WBC: 11.4 10*3/uL — ABNORMAL HIGH (ref 4.0–10.5)
nRBC: 0 % (ref 0.0–0.2)

## 2020-08-17 LAB — RESP PANEL BY RT-PCR (FLU A&B, COVID) ARPGX2
Influenza A by PCR: NEGATIVE
Influenza B by PCR: NEGATIVE
SARS Coronavirus 2 by RT PCR: NEGATIVE

## 2020-08-17 LAB — BASIC METABOLIC PANEL
Anion gap: 7 (ref 5–15)
BUN: 8 mg/dL (ref 6–20)
CO2: 22 mmol/L (ref 22–32)
Calcium: 8.9 mg/dL (ref 8.9–10.3)
Chloride: 107 mmol/L (ref 98–111)
Creatinine, Ser: 0.92 mg/dL (ref 0.61–1.24)
GFR, Estimated: >60 mL/min (ref >60)
Glucose, Bld: 102 mg/dL — ABNORMAL HIGH (ref 70–99)
Potassium: 4.1 mmol/L (ref 3.5–5.1)
Sodium: 136 mmol/L (ref 135–145)

## 2020-08-17 LAB — TROPONIN I (HIGH SENSITIVITY): Troponin I (High Sensitivity): 8 ng/L (ref <18)

## 2020-08-17 LAB — SARS CORONAVIRUS 2 (TAT 6-24 HRS): SARS Coronavirus 2: NEGATIVE

## 2020-08-17 MED ORDER — GUAIFENESIN ER 600 MG PO TB12: 600.0000 mg | ORAL_TABLET | Freq: Two times a day (BID) | ORAL | 0 refills | Status: AC

## 2020-08-17 MED ORDER — PSEUDOEPHEDRINE HCL 60 MG PO TABS: 60.0000 mg | ORAL_TABLET | Freq: Three times a day (TID) | ORAL | 0 refills | Status: DC | PRN

## 2020-08-17 MED ORDER — ACETAMINOPHEN 500 MG PO TABS: 1000.0000 mg | ORAL_TABLET | Freq: Four times a day (QID) | ORAL | Status: DC | PRN

## 2020-08-17 MED ORDER — CEPACOL SORE THROAT & COUGH 5-7.5 MG MT LOZG: 1.0000 | LOZENGE | Freq: Four times a day (QID) | OROMUCOSAL | 0 refills | Status: DC

## 2020-08-17 MED ORDER — FLUTICASONE PROPIONATE 50 MCG/ACT NA SUSP: 2.0000 | Freq: Every day | NASAL | 0 refills | Status: DC

## 2020-08-17 NOTE — ED Triage Notes (Signed)
Pt here with c/o fever and cough and aches , has had 4 negative home covid test

## 2020-08-17 NOTE — Discharge Instructions (Addendum)
You were seen in the emergency department for evaluation of chest pressure, nasal congestion, cough.  Your work-up in the emergency department was reassuringly negative for signs of heart damage, pneumonia or other disease inside your chest.  At this time, you are safe for discharge and I will call you with results from your COVID and flu swabs.  I have sent prescriptions for Sudafed, Flonase, Mucinex, and cepacol to the pharmacy.  Most of these can be bought over-the-counter, so asked the pharmacist for assistance.  Please return the emergency department if you have new or worsening shortness of breath, new or worsening chest pain, vomiting, persistent fevers, or any other concerning symptoms.
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
## 2020-08-17 NOTE — ED Notes (Signed)
Pt verbalizes understanding of discharge instructions. Opportunity for questions and answers were provided. Pt discharged from the ED.   ?

## 2020-09-02 ENCOUNTER — Encounter (HOSPITAL_COMMUNITY): Payer: Self-pay | Admitting: Emergency Medicine

## 2020-09-02 ENCOUNTER — Emergency Department (HOSPITAL_COMMUNITY): Payer: BC Managed Care – PPO

## 2020-09-02 ENCOUNTER — Other Ambulatory Visit: Payer: Self-pay

## 2020-09-02 ENCOUNTER — Emergency Department (HOSPITAL_COMMUNITY)
Admission: EM | Admit: 2020-09-02 | Discharge: 2020-09-03 | Disposition: A | Discharge status: Home / Self Care | Payer: BC Managed Care – PPO | Attending: Emergency Medicine | Admitting: Emergency Medicine

## 2020-09-02 DIAGNOSIS — W228XXA Striking against or struck by other objects, initial encounter: Secondary | ICD-10-CM | POA: Insufficient documentation

## 2020-09-02 DIAGNOSIS — Y9289 Other specified places as the place of occurrence of the external cause: Secondary | ICD-10-CM | POA: Diagnosis not present

## 2020-09-02 DIAGNOSIS — F17200 Nicotine dependence, unspecified, uncomplicated: Secondary | ICD-10-CM | POA: Insufficient documentation

## 2020-09-02 DIAGNOSIS — S60412A Abrasion of right middle finger, initial encounter: Secondary | ICD-10-CM | POA: Diagnosis not present

## 2020-09-02 DIAGNOSIS — Y9389 Activity, other specified: Secondary | ICD-10-CM | POA: Insufficient documentation

## 2020-09-02 DIAGNOSIS — M79641 Pain in right hand: Secondary | ICD-10-CM

## 2020-09-02 DIAGNOSIS — S6991XA Unspecified injury of right wrist, hand and finger(s), initial encounter: Secondary | ICD-10-CM | POA: Diagnosis present

## 2020-09-02 MED ORDER — ACETAMINOPHEN 500 MG PO TABS
1000.0000 mg | ORAL_TABLET | Freq: Once | ORAL | Status: AC
  Administered 2020-09-02: 1000 mg via ORAL
  Filled 2020-09-02: qty 2

## 2020-09-02 NOTE — ED Triage Notes (Signed)
Patient here with right hand pain after punching a handrail.  Laceration and swelling noted to middle knuckle.

## 2020-09-02 NOTE — ED Provider Notes (Signed)
Emergency Medicine Provider Triage Evaluation Note  Phillip Mcdonald , a 30 y.o. male  was evaluated in triage.  Pt complains of injury to right hand after punching a handrail approximately 1 hour prior.  Patient has swelling, tenderness, and laceration to dorsum of right hand.  She endorses numbness around.  patient is right-hand dominant.  Patient is unsure when his last tetanus shot was.  Review of Systems  Positive: Myalgia, wound, numbness Negative: Weakness  Physical Exam  BP (!) 152/106 (BP Location: Left Arm)   Pulse 95   Temp 98.1 F (36.7 C) (Oral)   Resp 16   SpO2 99%  Gen:   Awake, no distress   Resp:  Normal effort  MSK:   Laceration, and tenderness to dorsum of right hand.  Decreased range of motion to all digits of right hand secondary to complaints of pain.  +2 right radial pulse Other:    Medical Decision Making  Medically screening exam initiated at 9:46 PM.  Appropriate orders placed.  Phillip Mcdonald was informed that the remainder of the evaluation will be completed by another provider, this initial triage assessment does not replace that evaluation, and the importance of remaining in the ED until their evaluation is complete.  The patient appears stable so that the remainder of the work up may be completed by another provider.      Haskel Schroeder, PA-C 09/02/20 2149    Franne Forts, DO 09/03/20 0024

## 2020-09-03 NOTE — Discharge Instructions (Addendum)
Tylenol or motrin as needed for pain.  Ice can help reduce swelling as well. Can wear ace wrap for comfort for now. Return here for new concerns.
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
## 2021-02-04 DIAGNOSIS — M25511 Pain in right shoulder: Secondary | ICD-10-CM | POA: Insufficient documentation

## 2021-02-09 DIAGNOSIS — M19011 Primary osteoarthritis, right shoulder: Secondary | ICD-10-CM | POA: Insufficient documentation

## 2021-04-12 DIAGNOSIS — M19019 Primary osteoarthritis, unspecified shoulder: Secondary | ICD-10-CM | POA: Insufficient documentation

## 2021-06-27 ENCOUNTER — Emergency Department (HOSPITAL_COMMUNITY): Payer: Self-pay

## 2021-06-27 ENCOUNTER — Encounter (HOSPITAL_COMMUNITY): Payer: Self-pay | Admitting: Emergency Medicine

## 2021-06-27 ENCOUNTER — Other Ambulatory Visit: Payer: Self-pay

## 2021-06-27 ENCOUNTER — Emergency Department (HOSPITAL_COMMUNITY)
Admission: EM | Admit: 2021-06-27 | Discharge: 2021-06-27 | Disposition: A | Discharge status: Home / Self Care | Payer: Self-pay | Attending: Emergency Medicine | Admitting: Emergency Medicine

## 2021-06-27 DIAGNOSIS — Y92410 Unspecified street and highway as the place of occurrence of the external cause: Secondary | ICD-10-CM | POA: Diagnosis not present

## 2021-06-27 DIAGNOSIS — M25511 Pain in right shoulder: Secondary | ICD-10-CM | POA: Diagnosis not present

## 2021-06-27 DIAGNOSIS — R519 Headache, unspecified: Secondary | ICD-10-CM | POA: Insufficient documentation

## 2021-06-27 DIAGNOSIS — M545 Low back pain, unspecified: Secondary | ICD-10-CM | POA: Insufficient documentation

## 2021-06-27 DIAGNOSIS — R202 Paresthesia of skin: Secondary | ICD-10-CM | POA: Insufficient documentation

## 2021-06-27 MED ORDER — METHYLPREDNISOLONE 4 MG PO TBPK: ORAL_TABLET | Freq: Every day | ORAL | 0 refills | Status: DC

## 2021-06-27 MED ORDER — AMOXICILLIN-POT CLAVULANATE 875-125 MG PO TABS: 1.0000 | ORAL_TABLET | Freq: Two times a day (BID) | ORAL | 0 refills | Status: DC

## 2021-06-27 MED ORDER — METHYLPREDNISOLONE 4 MG PO TBPK: ORAL_TABLET | Freq: Every day | ORAL | 0 refills | Status: AC
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
## 2021-06-27 NOTE — ED Notes (Addendum)
Patient remains in room on phone at this time.
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
## 2022-01-12 ENCOUNTER — Emergency Department (HOSPITAL_COMMUNITY): Payer: Self-pay

## 2022-01-12 ENCOUNTER — Other Ambulatory Visit: Payer: Self-pay

## 2022-01-12 ENCOUNTER — Encounter (HOSPITAL_COMMUNITY): Payer: Self-pay

## 2022-01-12 ENCOUNTER — Emergency Department (HOSPITAL_COMMUNITY)
Admission: EM | Admit: 2022-01-12 | Discharge: 2022-01-12 | Disposition: A | Discharge status: Home / Self Care | Payer: Self-pay | Attending: Emergency Medicine | Admitting: Emergency Medicine

## 2022-01-12 DIAGNOSIS — Z1152 Encounter for screening for COVID-19: Secondary | ICD-10-CM | POA: Insufficient documentation

## 2022-01-12 DIAGNOSIS — J069 Acute upper respiratory infection, unspecified: Secondary | ICD-10-CM | POA: Insufficient documentation

## 2022-01-12 LAB — RESP PANEL BY RT-PCR (RSV, FLU A&B, COVID)  RVPGX2
Influenza A by PCR: NEGATIVE
Influenza B by PCR: NEGATIVE
Resp Syncytial Virus by PCR: NEGATIVE
SARS Coronavirus 2 by RT PCR: NEGATIVE

## 2022-01-12 MED ORDER — ACETAMINOPHEN 325 MG PO TABS
650.0000 mg | ORAL_TABLET | Freq: Once | ORAL | Status: AC
  Administered 2022-01-12: 650 mg via ORAL
  Filled 2022-01-12: qty 2

## 2022-01-12 NOTE — ED Triage Notes (Signed)
Patient c/o sore throat, cough, and a headache x 3 days.

## 2022-01-12 NOTE — Discharge Instructions (Addendum)
Return to the ED with any new or worsening signs or symptoms Your viral testing here was negative.  You most likely are suffering from a viral illness that is not covered with the testing we do.  You should begin taking ibuprofen for body aches and chills, Tylenol for headaches and fevers.  You may also purchase Zicam which will help shorten the duration of symptoms.  Please push fluids to include body armor, Pedialyte or anything with electrolytes.  Please eat low-fat high-protein diet. Please attach guide concerning viral illnesses

## 2022-01-12 NOTE — ED Provider Notes (Signed)
[facility]-EMERGENCY DEPT Provider Note   CSN: [id number] Arrival date & time: 01/12/22  1059     History  Chief Complaint  Patient presents with   Sore Throat   Cough   Headache    Phillip Mcdonald is a 31 y.o. male with medical history of appendicitis, sinusitis.  Patient presents to ED for evaluation of URI symptoms.  Patient reports that for the last 2 or 3 days he has had generalized bodyaches and chills, fever, sore throat, headache.  Patient reports that he has taken combination cold and flu medication from local pharmacy which has relieved his symptoms however symptoms always return.  Patient states that he does have positive sick contacts, states that his father-in-law and mother-in-law both are sick with unknown virus.  Patient denies any chest pain, shortness of breath, nausea, vomiting, lightheadedness or dizziness.  Patient denies vaccinations for COVID-19 or influenza.   Sore Throat Associated symptoms include headaches. Pertinent negatives include no chest pain and no shortness of breath.  Cough Associated symptoms: headaches and myalgias   Associated symptoms: no chest pain and no shortness of breath   Headache Associated symptoms: cough and myalgias   Associated symptoms: no dizziness        Home Medications Prior to Admission medications   Medication Sig Start Date End Date Taking? Authorizing Provider  acetaminophen (TYLENOL) 500 MG tablet Take 2 tablets (1,000 mg total) by mouth every 6 (six) hours. Patient not taking: Reported on 08/17/2020 05/20/18   Barnetta Chapel, PA-C  albuterol (VENTOLIN HFA) 108 (90 Base) MCG/ACT inhaler Inhale 1-2 puffs into the lungs every 6 (six) hours as needed for wheezing or shortness of breath. Patient not taking: Reported on 08/17/2020 10/09/19   Wallis Bamberg, PA-C  amoxicillin-clavulanate (AUGMENTIN) 875-125 MG tablet Take 1 tablet by mouth every 12 (twelve) hours. 06/27/21   Achille Rich, PA-C  benzonatate  (TESSALON) 100 MG capsule Take 1-2 capsules (100-200 mg total) by mouth 3 (three) times daily as needed. Patient not taking: Reported on 08/17/2020 10/09/19   Wallis Bamberg, PA-C  cetirizine (ZYRTEC ALLERGY) 10 MG tablet Take 1 tablet (10 mg total) by mouth daily. Patient not taking: Reported on 08/17/2020 10/09/19   Wallis Bamberg, PA-C  Dextromethorphan-Benzocaine (CEPACOL SORE THROAT & COUGH) 5-7.5 MG LOZG Use as directed 1 lozenge in the mouth or throat every 6 (six) hours. 08/17/20   Kommor, Madison, MD  diclofenac (VOLTAREN) 75 MG EC tablet Take 1 tablet (75 mg total) by mouth 2 (two) times daily. Patient not taking: Reported on 08/17/2020 07/23/20   Elson Areas, PA-C  EPINEPHrine 0.3 mg/0.3 mL IJ SOAJ injection Inject 0.3 mLs into the muscle as needed for anaphylaxis. 11/16/19   [provider]  fluticasone (FLONASE) 50 MCG/ACT nasal spray Place 2 sprays into both nostrils daily for 14 days. 08/17/20 08/31/20  Kommor, Madison, MD  gabapentin (NEURONTIN) 300 MG capsule Take 1 capsule (300 mg total) by mouth 3 (three) times daily. Patient not taking: Reported on 08/17/2020 05/20/18   Barnetta Chapel, PA-C  ibuprofen (ADVIL) 800 MG tablet Take 1 tablet (800 mg total) by mouth 3 (three) times daily. 04/29/19   Mare Ferrari, PA-C  methocarbamol (ROBAXIN) 500 MG tablet Take 1 tablet (500 mg total) by mouth 2 (two) times daily. Patient not taking: Reported on 08/17/2020 04/29/19   Mare Ferrari, PA-C  oxymetazoline (AFRIN NASAL SPRAY) 0.05 % nasal spray Place 1 spray into both nostrils 2 (two) times daily. Patient not taking:  Reported on 08/17/2020 08/19/17   Dagoberto Ligas I, PA-C  promethazine-dextromethorphan (PROMETHAZINE-DM) 6.25-15 MG/5ML syrup Take 5 mLs by mouth at bedtime as needed for cough. Patient not taking: Reported on 08/17/2020 10/09/19   Wallis Bamberg, PA-C  pseudoephedrine (SUDAFED) 60 MG tablet Take 1 tablet (60 mg total) by mouth every 8 (eight) hours as needed for congestion. 08/17/20   Kommor,  Madison, MD      Allergies    Morphine and related and Nutrasweet aspartame [aspartame]    Review of Systems   Review of Systems  Respiratory:  Positive for cough. Negative for shortness of breath.   Cardiovascular:  Negative for chest pain.  Musculoskeletal:  Positive for myalgias.  Neurological:  Positive for headaches. Negative for dizziness and light-headedness.  All other systems reviewed and are negative.   Physical Exam Updated Vital Signs BP (!) 157/116 (BP Location: Right Arm)   Pulse 94   Temp 97.8 F (36.6 C) (Oral)   Resp 18   Ht 5\' 11"  (1.803 m)   Wt 136.1 kg   SpO2 95%   BMI 41.84 kg/m  Physical Exam Vitals and nursing note reviewed.  Constitutional:      General: He is not in acute distress.    Appearance: Normal appearance. He is not ill-appearing, toxic-appearing or diaphoretic.  HENT:     Head: Normocephalic and atraumatic.     Nose: Nose normal. No congestion.     Mouth/Throat:     Mouth: Mucous membranes are moist.     Pharynx: Oropharynx is clear. Posterior oropharyngeal erythema present. No oropharyngeal exudate.  Eyes:     Extraocular Movements: Extraocular movements intact.     Conjunctiva/sclera: Conjunctivae normal.     Pupils: Pupils are equal, round, and reactive to light.  Cardiovascular:     Rate and Rhythm: Normal rate and regular rhythm.  Pulmonary:     Effort: Pulmonary effort is normal.     Breath sounds: Normal breath sounds. No wheezing.  Abdominal:     General: Abdomen is flat. Bowel sounds are normal.     Palpations: Abdomen is soft.     Tenderness: There is no abdominal tenderness.  Musculoskeletal:     Cervical back: Normal range of motion and neck supple. No tenderness.  Skin:    General: Skin is warm and dry.     Capillary Refill: Capillary refill takes less than 2 seconds.  Neurological:     Mental Status: He is alert and oriented to person, place, and time.     ED Results / Procedures / Treatments   Labs (all  labs ordered are listed, but only abnormal results are displayed) Labs Reviewed  RESP PANEL BY RT-PCR (RSV, FLU A&B, COVID)  [id number]    EKG None  Radiology DG Chest 2 View  Result Date: 01/12/2022 CLINICAL DATA:  Cough. EXAM: CHEST - 2 VIEW COMPARISON:  June 27, 2021. FINDINGS: The heart size and mediastinal contours are within normal limits. Both lungs are clear. The visualized skeletal structures are unremarkable. IMPRESSION: No active cardiopulmonary disease. Electronically Signed   By: June 29, 2021 M.D.   On: 01/12/2022 12:39    Procedures Procedures   Medications Ordered in ED Medications  acetaminophen (TYLENOL) tablet 650 mg (650 mg Oral Given 01/12/22 1242)    ED Course/ Medical Decision Making/ A&P                           Medical Decision  Making Amount and/or Complexity of Data Reviewed Radiology: ordered.  Risk OTC drugs.   31 year old male presents to ED for evaluation.  Please see HPI for further details.  On examination patient is afebrile, nontachycardic.  Patient lung sounds are clear bilaterally, he is not hypoxic.  Patient abdomen soft and compressible throughout.  Patient alert and oriented x 3.  Patient posterior oropharynx with erythema however no exudate.  Patient uvula midline, handling secretions appropriately.  Patient nontoxic in appearance.  Patient viral testing negative for all.  Patient chest x-ray shows no consolidations or effusions.  Patient EKG in sinus rhythm.  At this time, patient is likely suffering from viral illness.  Patient will be counseled on how to treat symptoms at home.  Patient was given return precautions and he voiced understanding.  Patient in office questions answered to his satisfaction.  The patient stable discharge at this time.  Final Clinical Impression(s) / ED Diagnoses Final diagnoses:  Viral URI with cough    Rx / DC Orders ED Discharge Orders     None         Clent Ridges 01/12/22  1303    Benjiman Core, MD 01/12/22 [phone]

## 2022-05-13 ENCOUNTER — Ambulatory Visit (INDEPENDENT_AMBULATORY_CARE_PROVIDER_SITE_OTHER): Payer: 59

## 2022-05-13 ENCOUNTER — Encounter (HOSPITAL_COMMUNITY): Payer: Self-pay | Admitting: Emergency Medicine

## 2022-05-13 ENCOUNTER — Ambulatory Visit (HOSPITAL_COMMUNITY)
Admission: EM | Admit: 2022-05-13 | Discharge: 2022-05-13 | Disposition: A | Discharge status: Home / Self Care | Payer: 59 | Attending: Internal Medicine | Admitting: Internal Medicine

## 2022-05-13 DIAGNOSIS — R0781 Pleurodynia: Secondary | ICD-10-CM

## 2022-05-13 DIAGNOSIS — R110 Nausea: Secondary | ICD-10-CM

## 2022-05-13 DIAGNOSIS — Y9364 Activity, baseball: Secondary | ICD-10-CM

## 2022-05-13 MED ORDER — ONDANSETRON 4 MG PO TBDP
ORAL_TABLET | ORAL | Status: AC
  Filled 2022-05-13: qty 1

## 2022-05-13 MED ORDER — ONDANSETRON 4 MG PO TBDP
4.0000 mg | ORAL_TABLET | Freq: Once | ORAL | Status: AC
  Administered 2022-05-13: 4 mg via ORAL

## 2022-05-13 MED ORDER — BACLOFEN 10 MG PO TABS: 10.0000 mg | ORAL_TABLET | Freq: Three times a day (TID) | ORAL | 0 refills | Status: DC

## 2022-05-13 NOTE — Discharge Instructions (Addendum)
X-ray is negative for acute rib fracture but does show well healed old rib fractures.  Use incentive spirometer 5-10 times an hour to encourage deep breathing and prevent pneumonia related to guarded breathing due to rib pain.  Ibuprofen 800mg  (4 over the counter 200 mg pills) every 8 hours as needed for pain. Baclofen muscle relaxer every 8 hours as needed for muscle spasm. Heat or ice therapy for further pain reduction 20 minutes on 20 minutes off as needed.  Schedule appointment with orthopedic provider for for follow-up in 1-2 weeks as needed.   Splint your abdomen with a pillow when you are coughing, sneezing, or laughing to prevent significant pain to your ribs.  Return to urgent care as needed if you develop fever, chills, cough, or any other new or worsening symptoms.
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
## 2022-05-13 NOTE — ED Triage Notes (Signed)
Pt played baseball this morning and as day got worse left rib pain has gotten worse esp with movement and deep breathing.

## 2022-09-16 ENCOUNTER — Ambulatory Visit: Payer: 59

## 2022-09-16 ENCOUNTER — Ambulatory Visit
Admission: EM | Admit: 2022-09-16 | Discharge: 2022-09-16 | Disposition: A | Discharge status: Home / Self Care | Payer: 59 | Attending: Internal Medicine | Admitting: Internal Medicine

## 2022-09-16 DIAGNOSIS — R200 Anesthesia of skin: Secondary | ICD-10-CM

## 2022-09-16 DIAGNOSIS — M79641 Pain in right hand: Secondary | ICD-10-CM | POA: Diagnosis not present

## 2022-09-16 DIAGNOSIS — S6721XA Crushing injury of right hand, initial encounter: Secondary | ICD-10-CM | POA: Diagnosis not present
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
## 2022-09-16 NOTE — ED Notes (Signed)
Patient is being discharged from the Urgent Care and sent to the Emergency Department via POV . Per RM, patient is in need of higher level of care due to numbness in hand with injury. Patient is aware and verbalizes understanding of plan of care.  Vitals:   09/16/22 1228  BP: 122/82  Pulse: 83  Resp: 20  Temp: 98.5 F (36.9 C)  SpO2: 96%

## 2022-09-21 ENCOUNTER — Emergency Department (HOSPITAL_COMMUNITY): Payer: 59

## 2022-09-21 ENCOUNTER — Emergency Department (HOSPITAL_COMMUNITY)
Admission: EM | Admit: 2022-09-21 | Discharge: 2022-09-21 | Disposition: A | Discharge status: Home / Self Care | Payer: 59 | Attending: Emergency Medicine | Admitting: Emergency Medicine

## 2022-09-21 ENCOUNTER — Encounter (HOSPITAL_COMMUNITY): Payer: Self-pay

## 2022-09-21 ENCOUNTER — Other Ambulatory Visit: Payer: Self-pay

## 2022-09-21 DIAGNOSIS — M545 Low back pain, unspecified: Secondary | ICD-10-CM | POA: Diagnosis present

## 2022-09-21 DIAGNOSIS — R109 Unspecified abdominal pain: Secondary | ICD-10-CM | POA: Diagnosis not present

## 2022-09-21 DIAGNOSIS — R350 Frequency of micturition: Secondary | ICD-10-CM | POA: Insufficient documentation

## 2022-09-21 LAB — BASIC METABOLIC PANEL
Anion gap: 11 (ref 5–15)
BUN: 16 mg/dL (ref 6–20)
CO2: 20 mmol/L — ABNORMAL LOW (ref 22–32)
Calcium: 9.1 mg/dL (ref 8.9–10.3)
Chloride: 105 mmol/L (ref 98–111)
Creatinine, Ser: 1.16 mg/dL (ref 0.61–1.24)
GFR, Estimated: >60 mL/min (ref >60)
Glucose, Bld: 118 mg/dL — ABNORMAL HIGH (ref 70–99)
Potassium: 3.6 mmol/L (ref 3.5–5.1)
Sodium: 136 mmol/L (ref 135–145)

## 2022-09-21 LAB — URINALYSIS, ROUTINE W REFLEX MICROSCOPIC
Bacteria, UA: NONE SEEN
Bilirubin Urine: NEGATIVE
Glucose, UA: NEGATIVE mg/dL
Ketones, ur: 5 mg/dL — AB
Leukocytes,Ua: NEGATIVE
Nitrite: NEGATIVE
Protein, ur: NEGATIVE mg/dL
Specific Gravity, Urine: 1.011 (ref 1.005–1.030)
pH: 5 (ref 5.0–8.0)

## 2022-09-21 LAB — CBC
HCT: 46 % (ref 39.0–52.0)
Hemoglobin: 16.2 g/dL (ref 13.0–17.0)
MCH: 30.7 pg (ref 26.0–34.0)
MCHC: 35.2 g/dL (ref 30.0–36.0)
MCV: 87.1 fL (ref 80.0–100.0)
Platelets: 186 10*3/uL (ref 150–400)
RBC: 5.28 MIL/uL (ref 4.22–5.81)
RDW: 11.8 % (ref 11.5–15.5)
WBC: 8.4 10*3/uL (ref 4.0–10.5)
nRBC: 0 % (ref 0.0–0.2)

## 2022-09-21 MED ORDER — CYCLOBENZAPRINE HCL 10 MG PO TABS
10.0000 mg | ORAL_TABLET | Freq: Two times a day (BID) | ORAL | 0 refills | Status: DC | PRN
Start: 2022-09-21 — End: 2023-05-04

## 2022-09-21 MED ORDER — ONDANSETRON 4 MG PO TBDP: 4.0000 mg | ORAL_TABLET | Freq: Three times a day (TID) | ORAL | 0 refills | Status: DC | PRN

## 2022-09-21 MED ORDER — ONDANSETRON 4 MG PO TBDP
4.0000 mg | ORAL_TABLET | Freq: Once | ORAL | Status: AC
  Administered 2022-09-21: 4 mg via ORAL
  Filled 2022-09-21: qty 1

## 2022-09-21 MED ORDER — OXYCODONE-ACETAMINOPHEN 5-325 MG PO TABS
1.0000 | ORAL_TABLET | ORAL | Status: DC | PRN
  Administered 2022-09-21: 1 via ORAL
  Filled 2022-09-21: qty 1

## 2022-09-21 NOTE — ED Triage Notes (Signed)
Patient reports started having left flank pain that started yesterday.  Reports increased frequency with urination.  Patient reports pain is making him sick to his stomach.

## 2022-09-21 NOTE — Discharge Instructions (Addendum)
Please read and follow all provided instructions.  Your diagnoses today include:  1. Left flank pain   2. Pain in left lumbar region of back   3. Urinary frequency     Tests performed today include: Blood cell counts and platelets: was normal Kidney function tests: was normal Urine test to look for infection: no sign of infection CT scan of the abdomen pelvis: No significant findings Vital signs. See below for your results today.   Medications prescribed:  Flexeril (cyclobenzaprine) - muscle relaxer medication  DO NOT drive or perform any activities that require you to be awake and alert because this medicine can make you drowsy.   Zofran (ondansetron) - for nausea and vomiting  Take any prescribed medications only as directed.  Home care instructions:  Follow any educational materials contained in this packet.  Follow-up instructions: Please follow-up with your primary care provider in the next 3 days for further evaluation of your symptoms.    Return instructions:  SEEK IMMEDIATE MEDICAL ATTENTION IF: The pain does not go away or becomes severe  A temperature above 101F develops  Repeated vomiting occurs (multiple episodes)  The pain becomes localized to portions of the abdomen. The right side could possibly be appendicitis. In an adult, the left lower portion of the abdomen could be colitis or diverticulitis.  Blood is being passed in stools or vomit (bright red or black tarry stools)  You develop chest pain, difficulty breathing, dizziness or fainting, or become confused, poorly responsive, or inconsolable (young children) If you have any other emergent concerns regarding your health  Additional Information: Abdominal (belly) pain can be caused by many things. Your caregiver performed an examination and possibly ordered blood/urine tests and imaging (CT scan, x-rays, ultrasound). Many cases can be observed and treated at home after initial evaluation in the emergency  department. Even though you are being discharged home, abdominal pain can be unpredictable. Therefore, you need a repeated exam if your pain does not resolve, returns, or worsens. Most patients with abdominal pain don't have to be admitted to the hospital or have surgery, but serious problems like appendicitis and gallbladder attacks can start out as nonspecific pain. Many abdominal conditions cannot be diagnosed in one visit, so follow-up evaluations are very important.  Your vital signs today were: BP (!) 131/90 (BP Location: Right Arm)   Pulse 92   Temp 97.9 F (36.6 C)   Resp 16   Ht 5\' 10"  (1.778 m)   Wt 133.8 kg   SpO2 97%   BMI 42.33 kg/m  If your blood pressure (bp) was elevated above 135/85 this visit, please have this repeated by your doctor within one month. --------------

## 2022-09-21 NOTE — ED Provider Notes (Signed)
[place] EMERGENCY DEPARTMENT AT [facility] Provider Note   CSN: [id number] Arrival date & time: 09/21/22  1340     History  Chief Complaint  Patient presents with   Flank Pain    Phillip Mcdonald is a 32 y.o. male.  Patient with history of previous appendectomy, chronic lower back pain -- presents the emergency department today for evaluation of lower back and flank pain.  Over the past 2 days patient has had increasing left flank pain.  Typically he has chronic pain that is in the midportion of the back however this is more lateral.  It is sharp and dull and the pain waxes and wanes.  It does get worse with certain positions.  He has had increased urinary frequency as well.  No hematuria, dysuria, penile discharge or drainage.  No fevers or vomiting but has been nauseous.       Home Medications Prior to Admission medications   Medication Sig Start Date End Date Taking? Authorizing Provider  acetaminophen (TYLENOL) 500 MG tablet Take 2 tablets (1,000 mg total) by mouth every 6 (six) hours. Patient not taking: Reported on 08/17/2020 05/20/18   Barnetta Chapel, PA-C  albuterol (VENTOLIN HFA) 108 (90 Base) MCG/ACT inhaler Inhale 1-2 puffs into the lungs every 6 (six) hours as needed for wheezing or shortness of breath. Patient not taking: Reported on 08/17/2020 10/09/19   Wallis Bamberg, PA-C  amoxicillin-clavulanate (AUGMENTIN) 875-125 MG tablet Take 1 tablet by mouth every 12 (twelve) hours. 06/27/21   Achille Rich, PA-C  baclofen (LIORESAL) 10 MG tablet Take 1 tablet (10 mg total) by mouth 3 (three) times daily. 05/13/22   Carlisle Beers, FNP  benzonatate (TESSALON) 100 MG capsule Take 1-2 capsules (100-200 mg total) by mouth 3 (three) times daily as needed. Patient not taking: Reported on 08/17/2020 10/09/19   Wallis Bamberg, PA-C  cetirizine (ZYRTEC ALLERGY) 10 MG tablet Take 1 tablet (10 mg total) by mouth daily. 10/09/19   Wallis Bamberg, PA-C  Dextromethorphan-Benzocaine  (CEPACOL SORE THROAT & COUGH) 5-7.5 MG LOZG Use as directed 1 lozenge in the mouth or throat every 6 (six) hours. 08/17/20   Kommor, Madison, MD  diclofenac (VOLTAREN) 75 MG EC tablet Take 1 tablet (75 mg total) by mouth 2 (two) times daily. Patient not taking: Reported on 08/17/2020 07/23/20   Elson Areas, PA-C  EPINEPHrine 0.3 mg/0.3 mL IJ SOAJ injection Inject 0.3 mLs into the muscle as needed for anaphylaxis. 11/16/19   [provider]  Esomeprazole Magnesium (NEXIUM PO) Take by mouth.    [provider]  fluticasone (FLONASE) 50 MCG/ACT nasal spray Place 2 sprays into both nostrils daily for 14 days. 08/17/20 08/31/20  Kommor, Madison, MD  gabapentin (NEURONTIN) 300 MG capsule Take 1 capsule (300 mg total) by mouth 3 (three) times daily. Patient not taking: Reported on 08/17/2020 05/20/18   Barnetta Chapel, PA-C  ibuprofen (ADVIL) 800 MG tablet Take 1 tablet (800 mg total) by mouth 3 (three) times daily. 04/29/19   Mare Ferrari, PA-C  oxymetazoline (AFRIN NASAL SPRAY) 0.05 % nasal spray Place 1 spray into both nostrils 2 (two) times daily. Patient not taking: Reported on 08/17/2020 08/19/17   Dagoberto Ligas I, PA-C  promethazine-dextromethorphan (PROMETHAZINE-DM) 6.25-15 MG/5ML syrup Take 5 mLs by mouth at bedtime as needed for cough. Patient not taking: Reported on 08/17/2020 10/09/19   Wallis Bamberg, PA-C  pseudoephedrine (SUDAFED) 60 MG tablet Take 1 tablet (60 mg total) by mouth every 8 (eight) hours  as needed for congestion. 08/17/20   Kommor, Wyn Forster, MD      Allergies    Morphine and codeine and Aspartame    Review of Systems   Review of Systems  Physical Exam Updated Vital Signs BP (!) 131/90 (BP Location: Right Arm)   Pulse 92   Temp 97.9 F (36.6 C)   Resp 16   Ht 5\' 10"  (1.778 m)   Wt 133.8 kg   SpO2 97%   BMI 42.33 kg/m   Physical Exam Vitals and nursing note reviewed.  Constitutional:      General: He is not in acute distress.    Appearance: He is  well-developed.  HENT:     Head: Normocephalic and atraumatic.  Eyes:     General:        Right eye: No discharge.        Left eye: No discharge.     Conjunctiva/sclera: Conjunctivae normal.  Cardiovascular:     Rate and Rhythm: Normal rate and regular rhythm.     Heart sounds: Normal heart sounds.  Pulmonary:     Effort: Pulmonary effort is normal.     Breath sounds: Normal breath sounds.  Abdominal:     Palpations: Abdomen is soft.     Tenderness: There is no abdominal tenderness.  Musculoskeletal:     Cervical back: Normal range of motion and neck supple. No tenderness. Normal range of motion.     Thoracic back: Tenderness present. No spasms. Normal range of motion.     Lumbar back: Tenderness present. No spasms. Normal range of motion.       Back:     Comments: Patient was left sided lumbar paraspinous tenderness, jumps when I palpate over this area.  Skin:    General: Skin is warm and dry.  Neurological:     Mental Status: He is alert.     ED Results / Procedures / Treatments   Labs (all labs ordered are listed, but only abnormal results are displayed) Labs Reviewed  URINALYSIS, ROUTINE W REFLEX MICROSCOPIC - Abnormal; Notable for the following components:      Result Value   Hgb urine dipstick SMALL (*)    Ketones, ur 5 (*)    All other components within normal limits  BASIC METABOLIC PANEL - Abnormal; Notable for the following components:   CO2 20 (*)    Glucose, Bld 118 (*)    All other components within normal limits  CBC    EKG None  Radiology CT Renal Stone Study  Result Date: 09/21/2022 CLINICAL DATA:  Left flank pain. EXAM: CT ABDOMEN AND PELVIS WITHOUT CONTRAST TECHNIQUE: Multidetector CT imaging of the abdomen and pelvis was performed following the standard protocol without IV contrast. RADIATION DOSE REDUCTION: This exam was performed according to the departmental dose-optimization program which includes automated exposure control, adjustment of the  mA and/or kV according to patient size and/or use of iterative reconstruction technique. COMPARISON:  May 25, 2016 FINDINGS: Lower chest: No acute abnormality. Hepatobiliary: No focal liver abnormality is seen. No gallstones, gallbladder wall thickening, or biliary dilatation. Pancreas: Unremarkable. No pancreatic ductal dilatation or surrounding inflammatory changes. Spleen: Normal in size without focal abnormality. Adrenals/Urinary Tract: Adrenal glands are unremarkable. Kidneys are normal, without renal calculi, focal lesion, or hydronephrosis. The urinary bladder is poorly distended and subsequently limited in evaluation. Mild diffuse urinary bladder wall thickening is noted. Stomach/Bowel: Stomach is within normal limits. The appendix is surgically absent. No evidence of bowel wall thickening,  distention, or inflammatory changes. Vascular/Lymphatic: No significant vascular findings are present. No enlarged abdominal or pelvic lymph nodes. Reproductive: Prostate is unremarkable. Other: No abdominal wall hernia or abnormality. No abdominopelvic ascites. Musculoskeletal: No acute or significant osseous findings. IMPRESSION: 1. Mild diffuse urinary bladder wall thickening which may be secondary to poor distention. Correlation with urinalysis is recommended to exclude the presence of cystitis. 2. Evidence of prior appendectomy. Electronically Signed   By: Aram Candela M.D.   On: 09/21/2022 15:30    Procedures Procedures    Medications Ordered in ED Medications  oxyCODONE-acetaminophen (PERCOCET/ROXICET) 5-325 MG per tablet 1 tablet (1 tablet Oral Given 09/21/22 1401)  ondansetron (ZOFRAN-ODT) disintegrating tablet 4 mg (4 mg Oral Given 09/21/22 1401)    ED Course/ Medical Decision Making/ A&P    Patient seen and examined. History obtained directly from patient. Work-up including labs, imaging, EKG ordered in triage, if performed, were reviewed.    Labs/EKG: Independently reviewed and interpreted.   This included: CBC which was unremarkable; BMP with glucose of 118, normal anion gap, normal electrolytes and kidney function; UA with small blood on dipstick but 0-5 red cells per high-power field, no compelling signs of infection.  Imaging: Independently reviewed and interpreted.  This included: CT abdomen pelvis, no kidney stones noted, agree no significant musculoskeletal findings.  Medications/Fluids: Patient has received oral Percocet and Zofran prior to my exam.  Most recent vital signs reviewed and are as follows: BP (!) 131/90 (BP Location: Right Arm)   Pulse 92   Temp 97.9 F (36.6 C)   Resp 16   Ht 5\' 10"  (1.778 m)   Wt 133.8 kg   SpO2 97%   BMI 42.33 kg/m   Initial impression: Left-sided flank and lower back pain, increased urinary frequency.  Reassuring workup.  Symptoms are most consistent with musculoskeletal pain at this point.  Home treatment plan: Rest, continued OTC meds, Flexeril  Patient counseled on proper use of muscle relaxant medication.  They were told not to drink alcohol, drive any vehicle, or do any dangerous activities while taking this medication.  Patient verbalized understanding.  Return instructions discussed with patient: The patient was urged to return to the Emergency Department immediately with worsening of current symptoms, worsening abdominal pain, persistent vomiting, blood noted in stools, fever, or any other concerns. The patient verbalized understanding.   Follow-up instructions discussed with patient: Encouraged to follow-up with PCP or orthopedist when able.  States that he has had right shoulder surgery in the past by [facility].                                Medical Decision Making Amount and/or Complexity of Data Reviewed Labs: ordered.  Risk Prescription drug management.   For this patient's complaint of flank pain, the following conditions were considered on the differential diagnosis: gastritis/PUD, enteritis/duodenitis,  appendicitis, cholelithiasis/cholecystitis, cholangitis, pancreatitis, ruptured viscus, colitis, diverticulitis, small/large bowel obstruction, proctitis, cystitis, pyelonephritis, ureteral colic, aortic dissection, aortic aneurysm. In women, ectopic pregnancy, pelvic inflammatory disease, ovarian cysts, and tubo-ovarian abscess were also considered. Atypical chest etiologies were also considered including ACS, PE, and pneumonia.  Exam is concerning for musculoskeletal pain as patient is very tender to palpation in the areas where he is having the most pain.  Reassuring CT imaging and labs/UA.  The patient's vital signs, pertinent lab work and imaging were reviewed and interpreted as discussed in the ED course. Hospitalization was considered for  further testing, treatments, or serial exams/observation. However as patient is well-appearing, has a stable exam, and reassuring studies today, I do not feel that they warrant admission at this time. This plan was discussed with the patient who verbalizes agreement and comfort with this plan and seems reliable and able to return to the Emergency Department with worsening or changing symptoms.           Final Clinical Impression(s) / ED Diagnoses Final diagnoses:  Left flank pain  Pain in left lumbar region of back  Urinary frequency    Rx / DC Orders ED Discharge Orders          Ordered    cyclobenzaprine (FLEXERIL) 10 MG tablet  2 times daily PRN        09/21/22 1626    ondansetron (ZOFRAN-ODT) 4 MG disintegrating tablet  Every 8 hours PRN        09/21/22 1626              Renne Crigler, PA-C 09/21/22 1634    Rondel Baton, MD 09/23/22 1057

## 2023-05-04 ENCOUNTER — Ambulatory Visit (HOSPITAL_COMMUNITY)
Admission: EM | Admit: 2023-05-04 | Discharge: 2023-05-04 | Disposition: A | Discharge status: Home / Self Care | Attending: Physician Assistant | Admitting: Physician Assistant

## 2023-05-04 ENCOUNTER — Encounter (HOSPITAL_COMMUNITY): Payer: Self-pay | Admitting: Emergency Medicine

## 2023-05-04 DIAGNOSIS — R051 Acute cough: Secondary | ICD-10-CM | POA: Diagnosis not present

## 2023-05-04 DIAGNOSIS — R0789 Other chest pain: Secondary | ICD-10-CM | POA: Diagnosis not present

## 2023-05-04 DIAGNOSIS — J101 Influenza due to other identified influenza virus with other respiratory manifestations: Secondary | ICD-10-CM

## 2023-05-04 LAB — POC COVID19/FLU A&B COMBO
Covid Antigen, POC: NEGATIVE
Influenza A Antigen, POC: POSITIVE — AB
Influenza B Antigen, POC: NEGATIVE

## 2023-05-04 MED ORDER — PROMETHAZINE-DM 6.25-15 MG/5ML PO SYRP
5.0000 mL | ORAL_SOLUTION | Freq: Four times a day (QID) | ORAL | 0 refills | Status: AC | PRN
Start: 2023-05-04 — End: ?

## 2023-05-04 MED ORDER — ALBUTEROL SULFATE HFA 108 (90 BASE) MCG/ACT IN AERS
INHALATION_SPRAY | RESPIRATORY_TRACT | Status: AC
  Filled 2023-05-04: qty 6.7

## 2023-05-04 MED ORDER — ALBUTEROL SULFATE HFA 108 (90 BASE) MCG/ACT IN AERS
2.0000 | INHALATION_SPRAY | Freq: Once | RESPIRATORY_TRACT | Status: AC
  Administered 2023-05-04: 2 via RESPIRATORY_TRACT

## 2023-05-04 MED ORDER — PREDNISONE 20 MG PO TABS
40.0000 mg | ORAL_TABLET | Freq: Every day | ORAL | 0 refills | Status: AC
Start: 2023-05-04 — End: 2023-05-08

## 2023-05-04 NOTE — ED Triage Notes (Signed)
 Pt reports had headache for 4 days. For past couple days had dry cough, chills, cold sweats fatigue and fevers. Took Ibuprofen .
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
## 2023-05-04 NOTE — Discharge Instructions (Signed)
 He tested positive for influenza A.  You are outside the window where we would use Tamiflu.  Use albuterol  every 4-6 hours as needed for shortness of breath.  Start prednisone  40 mg for 4 days.  Do not take NSAIDs with this medication including aspirin, ibuprofen /Advil , naproxen /Aleve .  Use Promethazine  DM for cough.  This will make you sleepy so do not drive or drink alcohol while taking it.  Use over-the-counter medications including Mucinex , Flonase , Tylenol .  Make sure you rest and drink plenty of fluid.  If your symptoms are not improving within a few days or if anything worsens and you have persistent high fever, chest pain, shortness of breath despite medication, weakness, nausea/vomiting you need to be seen immediately.

## 2023-07-27 ENCOUNTER — Emergency Department (HOSPITAL_COMMUNITY)
Admission: EM | Admit: 2023-07-27 | Discharge: 2023-07-27 | Disposition: A | Discharge status: Home / Self Care | Attending: Emergency Medicine | Admitting: Emergency Medicine

## 2023-07-27 ENCOUNTER — Encounter (HOSPITAL_COMMUNITY): Payer: Self-pay | Admitting: Pharmacy Technician

## 2023-07-27 ENCOUNTER — Emergency Department (HOSPITAL_COMMUNITY)

## 2023-07-27 ENCOUNTER — Other Ambulatory Visit: Payer: Self-pay

## 2023-07-27 DIAGNOSIS — R509 Fever, unspecified: Secondary | ICD-10-CM | POA: Insufficient documentation

## 2023-07-27 DIAGNOSIS — R110 Nausea: Secondary | ICD-10-CM | POA: Diagnosis not present

## 2023-07-27 DIAGNOSIS — F17210 Nicotine dependence, cigarettes, uncomplicated: Secondary | ICD-10-CM | POA: Diagnosis not present

## 2023-07-27 DIAGNOSIS — R0600 Dyspnea, unspecified: Secondary | ICD-10-CM | POA: Insufficient documentation

## 2023-07-27 DIAGNOSIS — R42 Dizziness and giddiness: Secondary | ICD-10-CM | POA: Diagnosis not present

## 2023-07-27 DIAGNOSIS — R079 Chest pain, unspecified: Secondary | ICD-10-CM | POA: Diagnosis present

## 2023-07-27 DIAGNOSIS — M791 Myalgia, unspecified site: Secondary | ICD-10-CM | POA: Diagnosis not present

## 2023-07-27 DIAGNOSIS — R519 Headache, unspecified: Secondary | ICD-10-CM | POA: Diagnosis not present

## 2023-07-27 LAB — HEPATIC FUNCTION PANEL
ALT: 27 U/L (ref 0–44)
AST: 22 U/L (ref 15–41)
Albumin: 3.8 g/dL (ref 3.5–5.0)
Alkaline Phosphatase: 70 U/L (ref 38–126)
Bilirubin, Direct: 0.1 mg/dL (ref 0.0–0.2)
Indirect Bilirubin: 0.6 mg/dL (ref 0.3–0.9)
Total Bilirubin: 0.7 mg/dL (ref 0.0–1.2)
Total Protein: 6.3 g/dL — ABNORMAL LOW (ref 6.5–8.1)

## 2023-07-27 LAB — BASIC METABOLIC PANEL WITH GFR
Anion gap: 10 (ref 5–15)
BUN: 10 mg/dL (ref 6–20)
CO2: 21 mmol/L — ABNORMAL LOW (ref 22–32)
Calcium: 9.3 mg/dL (ref 8.9–10.3)
Chloride: 109 mmol/L (ref 98–111)
Creatinine, Ser: 0.96 mg/dL (ref 0.61–1.24)
GFR, Estimated: >60 mL/min (ref >60)
Glucose, Bld: 93 mg/dL (ref 70–99)
Potassium: 4 mmol/L (ref 3.5–5.1)
Sodium: 140 mmol/L (ref 135–145)

## 2023-07-27 LAB — CBC
HCT: 45.1 % (ref 39.0–52.0)
Hemoglobin: 15.4 g/dL (ref 13.0–17.0)
MCH: 30 pg (ref 26.0–34.0)
MCHC: 34.1 g/dL (ref 30.0–36.0)
MCV: 87.7 fL (ref 80.0–100.0)
Platelets: 187 K/uL (ref 150–400)
RBC: 5.14 MIL/uL (ref 4.22–5.81)
RDW: 12.1 % (ref 11.5–15.5)
WBC: 5.4 K/uL (ref 4.0–10.5)
nRBC: 0 % (ref 0.0–0.2)

## 2023-07-27 LAB — TROPONIN I (HIGH SENSITIVITY)
Troponin I (High Sensitivity): 2 ng/L (ref <18)
Troponin I (High Sensitivity): 2 ng/L (ref <18)

## 2023-07-27 LAB — RESP PANEL BY RT-PCR (RSV, FLU A&B, COVID)  RVPGX2
Influenza A by PCR: NEGATIVE
Influenza B by PCR: NEGATIVE
Resp Syncytial Virus by PCR: NEGATIVE
SARS Coronavirus 2 by RT PCR: NEGATIVE

## 2023-07-27 LAB — LIPASE, BLOOD: Lipase: 33 U/L (ref 11–51)

## 2023-07-27 LAB — MAGNESIUM: Magnesium: 2.1 mg/dL (ref 1.7–2.4)

## 2023-07-27 MED ORDER — KETOROLAC TROMETHAMINE 15 MG/ML IJ SOLN
15.0000 mg | Freq: Once | INTRAMUSCULAR | Status: AC
  Administered 2023-07-27: 15 mg via INTRAVENOUS
  Filled 2023-07-27: qty 1

## 2023-07-27 MED ORDER — PROCHLORPERAZINE EDISYLATE 10 MG/2ML IJ SOLN
5.0000 mg | Freq: Once | INTRAMUSCULAR | Status: AC
  Administered 2023-07-27: 5 mg via INTRAVENOUS
  Filled 2023-07-27: qty 2

## 2023-07-27 MED ORDER — DIPHENHYDRAMINE HCL 25 MG PO CAPS
25.0000 mg | ORAL_CAPSULE | Freq: Once | ORAL | Status: AC
  Administered 2023-07-27: 25 mg via ORAL
  Filled 2023-07-27: qty 1

## 2023-07-27 MED ORDER — ALUM & MAG HYDROXIDE-SIMETH 200-200-20 MG/5ML PO SUSP
30.0000 mL | Freq: Once | ORAL | Status: AC
  Administered 2023-07-27: 30 mL via ORAL
  Filled 2023-07-27: qty 30

## 2023-07-27 MED ORDER — SODIUM CHLORIDE 0.9 % IV BOLUS
1000.0000 mL | Freq: Once | INTRAVENOUS | Status: AC
  Administered 2023-07-27: 1000 mL via INTRAVENOUS

## 2023-07-27 MED ORDER — AZITHROMYCIN 250 MG PO TABS: 250.0000 mg | ORAL_TABLET | Freq: Every day | ORAL | 0 refills | Status: AC

## 2023-07-27 MED ORDER — PANTOPRAZOLE SODIUM 20 MG PO TBEC: 20.0000 mg | DELAYED_RELEASE_TABLET | Freq: Every day | ORAL | 0 refills | Status: AC

## 2023-07-27 NOTE — Discharge Instructions (Addendum)
Return to the Emergency Department if you have unusual chest pain, pressure, or discomfort, shortness of breath, nausea, vomiting, burping, heartburn, tingling upper body parts, sweating, cold, clammy skin, or racing heartbeat. Call 911 if you think you are having a heart attack. Take all cardiac medications as prescribed - notify your doctor if you have any side effects. Follow cardiac diet - avoid fatty & fried foods, don't eat too much red meat, eat lots of fruits & vegetables, and dairy products should be low fat. Please lose weight if you are overweight. Become more active with walking, gardening, or any other activity that gets you to moving.   Please return to the emergency department immediately for any new or concerning symptoms, or if you get worse. 
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
## 2023-07-27 NOTE — ED Triage Notes (Signed)
 Pt here with reports of intermittent chest pain and dizziness since yesterday. States today developed generalized weakness/fatigue. Reports weakness and dizziness worse with standing. Also endorses headache.
